# Patient Record
Sex: Male | Born: 1960 | Race: White | Hispanic: No | Marital: Married | Smoking: Former smoker
Health system: Southern US, Community
[De-identification: ages and names within clinical notes are randomized; demographics above are authoritative.]

## PROBLEM LIST (undated history)

## (undated) DIAGNOSIS — M549 Dorsalgia, unspecified: Secondary | ICD-10-CM

## (undated) DIAGNOSIS — R002 Palpitations: Secondary | ICD-10-CM

## (undated) DIAGNOSIS — I10 Essential (primary) hypertension: Secondary | ICD-10-CM

## (undated) DIAGNOSIS — I480 Paroxysmal atrial fibrillation: Secondary | ICD-10-CM

## (undated) DIAGNOSIS — E785 Hyperlipidemia, unspecified: Secondary | ICD-10-CM

## (undated) DIAGNOSIS — G8929 Other chronic pain: Secondary | ICD-10-CM

## (undated) HISTORY — DX: Paroxysmal atrial fibrillation: I48.0

## (undated) HISTORY — DX: Essential (primary) hypertension: I10

## (undated) HISTORY — DX: Other chronic pain: G89.29

## (undated) HISTORY — DX: Hyperlipidemia, unspecified: E78.5

## (undated) HISTORY — DX: Palpitations: R00.2

## (undated) HISTORY — DX: Dorsalgia, unspecified: M54.9

---

## 2002-04-17 ENCOUNTER — Emergency Department (HOSPITAL_COMMUNITY): Admission: EM | Admit: 2002-04-17 | Discharge: 2002-04-17 | Payer: Self-pay | Admitting: Emergency Medicine

## 2002-04-17 ENCOUNTER — Encounter: Payer: Self-pay | Admitting: Emergency Medicine

## 2003-10-13 ENCOUNTER — Ambulatory Visit (HOSPITAL_COMMUNITY): Admission: RE | Admit: 2003-10-13 | Discharge: 2003-10-13 | Payer: Self-pay | Admitting: Gastroenterology

## 2003-10-13 ENCOUNTER — Encounter (INDEPENDENT_AMBULATORY_CARE_PROVIDER_SITE_OTHER): Payer: Self-pay | Admitting: *Deleted

## 2004-01-13 ENCOUNTER — Encounter: Admission: RE | Admit: 2004-01-13 | Discharge: 2004-01-13 | Payer: Self-pay | Admitting: Gastroenterology

## 2007-05-30 ENCOUNTER — Inpatient Hospital Stay (HOSPITAL_COMMUNITY): Admission: EM | Admit: 2007-05-30 | Discharge: 2007-05-31 | Payer: Self-pay | Admitting: Emergency Medicine

## 2007-05-30 ENCOUNTER — Ambulatory Visit: Payer: Self-pay | Admitting: Cardiology

## 2007-05-30 ENCOUNTER — Encounter (INDEPENDENT_AMBULATORY_CARE_PROVIDER_SITE_OTHER): Payer: Self-pay | Admitting: *Deleted

## 2007-05-30 HISTORY — PX: TRANSTHORACIC ECHOCARDIOGRAM: SHX275

## 2008-05-15 ENCOUNTER — Encounter: Admission: RE | Admit: 2008-05-15 | Discharge: 2008-05-15 | Payer: Self-pay | Admitting: Family Medicine

## 2008-12-12 ENCOUNTER — Encounter: Admission: RE | Admit: 2008-12-12 | Discharge: 2008-12-12 | Payer: Self-pay | Admitting: Neurosurgery

## 2008-12-24 HISTORY — PX: CARDIOVASCULAR STRESS TEST: SHX262

## 2009-12-21 ENCOUNTER — Ambulatory Visit: Payer: Self-pay | Admitting: Cardiology

## 2010-05-25 ENCOUNTER — Other Ambulatory Visit: Payer: Self-pay | Admitting: *Deleted

## 2010-05-25 DIAGNOSIS — I480 Paroxysmal atrial fibrillation: Secondary | ICD-10-CM

## 2010-05-25 MED ORDER — DILTIAZEM HCL ER COATED BEADS 240 MG PO CP24: 240.0000 mg | ORAL_CAPSULE | Freq: Every day | ORAL | Status: DC

## 2010-05-25 NOTE — Telephone Encounter (Signed)
Pt called, c/o a constant headache after switching to Diltiazem 300 mg daily. No a.fib episodes recently and BP ok.  Norma Fredrickson NP notified and instructed RN to have pt go back to Diltiazem 240 mg daily and continue to monitor BP.  Patient notified and verbalized to RN understanding of instructions.

## 2010-07-20 NOTE — H&P (Signed)
[name], [name]                ACCOUNT NO.:  [id number]   MEDICAL RECORD NO.:  [id number]          PATIENT TYPE:  EMS   LOCATION:  ED                           FACILITY:  [facility]   PHYSICIAN:  Beckey Rutter, MD  DATE OF BIRTH:  September 09, 1960   DATE OF ADMISSION:  05/30/2007  DATE OF DISCHARGE:                              HISTORY & PHYSICAL   PRIMARY CARE PHYSICIAN:  Production assistant, radio.   CHIEF COMPLAINT:  Chest pain.   HISTORY OF PRESENT ILLNESS:  This is a 50 year old pleasant Caucasian  male with past medical history significant for hypertension and history  of ulcerative colitis, who came in today to the emergency department  because of chest pain that started yesterday.  The pain is intermittent  in nature, associated with palpitation.  The pain is dull, anterior left  chest area, with no radiation.  The pain has an on-and-off pattern and  each lasting for several minutes.  Again, no radiation and no  significant association with other symptoms like sweating.  The patient  noticed heart racing and sweating after midnight, and that is when his  wife noticed that his heart was beating very fast and irregular as well,  so the patient presented himself to the emergency department.   REVIEW OF SYSTEMS:  The patient is a former smoker.  No history of  rheumatic heart disease or valvular disease.  No history of any heart  condition previously.  No significant excessive alcohol or cocaine.  No  history of DVT or premature coronary artery disease.  No recent travel.   PAST MEDICAL HISTORY:  1. Hypertension.  2. Hyperlipidemia.  3. Tobacco abuse.  4. Ulcerative colitis.   MEDICATION ALLERGY:  Not known to have medication allergy.   MEDICATIONS:  1. Altace 5 mg daily.  2. Vitamin E 3200 international units daily.  3. Asacol three tablets twice a day.   FAMILY HISTORY:  Significant for hypertension.   SOCIAL HISTORY:  The patient is a current smoker.  No drug abuse.   No  ethanol abuse.  He is an occasional drinker.  Lives with his wife.   REVIEW OF SYSTEMS:  As mentioned above.   EXAM:  Temperature 97.9, blood pressure 112/76, pulse 74, respiratory  rate is 14.  Head atraumatic, normocephalic.  Eyes:  PERRL.  Mouth moist.  No ulcer.  NECK:  Supple, no JVD.  PRECORDIUM:  First and second heart sounds audible, regular to  auscultation.  LUNGS:  Bilateral fair air entry.  ABDOMEN:  Soft, nontender.  Bowel sounds present.  EXTREMITIES: No lower extremity edema.   LABS AND X-RAY:  EKG showing heart rate of 108 with atrial fibrillation,  normal axis, normal QRS complexes.  Note, the patient had sinus rhythm  at rate of 71 on the monitor when the patient was examined now.  Sodium  137, potassium 4.0, chloride 107, carbon dioxide 24, glucose 112, BUN  15, creatinine 0.77, calcium 9.1.  Myoglobin is 26.3, CK-MB less than  1.3, troponin less than 0.05.   ASSESSMENT AND PLAN:  This is a 50 year old  with hyperlipidemia, who  presented today with atrial fibrillation with rapid ventricular response  that responded to Cardizem for rate control.  Now the patient on  auscultation and on monitor seems to be back to sinus rhythm.  The  patient also was complaining of chest pain, that he probably needs to be  ruled out for acute coronary syndrome.   PLAN:  1. The patient will be admitted to a telemetry floor for further      assessment and management.  2. The patient will be ruled out with serial cardiac enzymes and      serial EKGs.  3. We will obtain 2-D echo and will obtain an EKG stat to document      what appeared to be sinus rhythm now.  4. We will start the patient on aspirin and Lovenox.  5. For ulcerative colitis we will continue the Asacol.  6. For her hypertension we will switch the patient from Altace to a      calcium channel blocker for rate control.  7. GI prophylaxis:  We will start the patient on Protonix.      Beckey Rutter, MD   Electronically Signed     EME/MEDQ  D:  05/30/2007  T:  05/30/2007  Job:  [id number]

## 2010-07-20 NOTE — Op Note (Signed)
[name], DEVONSHIRE                ACCOUNT NO.:  [id number]   MEDICAL RECORD NO.:  [id number]          PATIENT TYPE:  INP   LOCATION:  1445                         FACILITY:  [facility]   PHYSICIAN:  Colleen Can. Deborah Chalk, M.D.DATE OF BIRTH:  Sep 26, 1960   DATE OF PROCEDURE:  05/31/2007  DATE OF DISCHARGE:                               OPERATIVE REPORT   Thank you very much for asking me to see Mr. Habenicht.   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  He has some vague anterior chest discomfort.  There was some  lightheadedness 6 months ago when he would get in and out of his car,  but he really did not have any lightheadedness with the symptoms.  He  has noted about an 8 month duration of feeling like he will have  intermittent palpitations and generalized nervousness that is somewhat  internal, but will be short-lived.  There was some vague anterior chest  discomfort over the day prior to admission.  It has not been exertional  and generally he has a very active lifestyle.   He has history of alcohol use of about 3 beers 3 times a week.  He only  drinks 2 cups of coffee in the morning.  He does smoke a pack of  cigarettes and has done for probably 30-40 years.   PAST MEDICAL HISTORY:  Includes hypertension for the last 3-4 years,  treated with Altace.  He has had some cholesterol issues, of which are  not clearly defined.  He has had low HDL cholesterol.  He has had a  history of ulcerative colitis, treated with Asacol 3 times a day, and  hydrocortisone suppositories, directed by Dr. Georgiana Shore.   SOCIAL HISTORY:  He is married.  He runs a golf course.  Smokes, as  noted, and has 3 beers a day.   FAMILY HISTORY:  Positive for hypertension.  There is no coronary  disease.   REVIEW OF SYSTEMS:  Negative otherwise,  discussed in detail.   PHYSICAL EXAMINATION:  On exam his blood pressure is 115/70.  Currently  it is 70.  HEENT:  Negative.  NECK:  Supple.  LUNGS:  Clear.  HEART:  Shows regular rate and rhythm.  EXTREMITIES:  Without edema.   White count is 4,900.  Hematocrit is 44.  Platelets are 202,000.  Chemistries are all negative.  Troponins and cardiac enzymes are  negative.  BNP is less than 30.  TSH is 3.556.  Drug abuse screen is  negative.  Homocystine level is 8.2.   IMPRESSION:  1. Paroxysmal atrial fibrillation.  2. Cigarette abuse.  3. Hypercholesterolemia.  4. Hypertension.   PLAN:  Will check a 2D echocardiogram.  Will allow him to go home today.  Will discharge on Cardizem CD 120 mg a day and discontinue Altace and  aspirin 325 mg per day.  I will plan for stress Cardiolite study and I  will arrange.  Colleen Can. Deborah Chalk, M.D.  Electronically Signed     SNT/MEDQ  D:  05/31/2007  T:  05/31/2007  Job:  [id number]

## 2010-07-20 NOTE — Discharge Summary (Signed)
[name], [name]                ACCOUNT NO.:  [id number]   MEDICAL RECORD NO.:  [id number]          PATIENT TYPE:  INP   LOCATION:  1445                         FACILITY:  [facility]   PHYSICIAN:  Beckey Rutter, MD  DATE OF BIRTH:  06-Jul-1960   DATE OF ADMISSION:  05/30/2007  DATE OF DISCHARGE:                               DISCHARGE SUMMARY   PRIMARY CARE PHYSICIAN:  [facility].   CHIEF COMPLAINT:  On presentation, chest pain and palpitation.   HOSPITAL COURSE:  1. Atrial fibrillation.  The patient found to have atrial fibrillation      on admission that was converted to sinus after Cardizem injection      was given to control the rate.  The patient remained sinus since      yesterday on telemetry.  The patient was ruled out during the      hospital course for acute coronary syndromes by serial cardiac      enzymes and EKGs.   The patient was seen in consultation by Dr. Roger Shelter who was  recommending the stress Cardiolite as an outpatient.  The patient was  given a prescription for Cardizem CD 120 and he was encouraged to  continue on daily aspirin.  The patient does not require anticoagulation  as of now, i.e., chronic Coumadin use.  The patient is stable for  discharge today.  He is aware and agreeable to the discharge plan.   DISCHARGE MEDICATIONS:  1. Aspirin 325 mg daily.  2. Cardizem CD 120 mg daily.  3. Asacol 3 tablets twice a day.  4. Vitamin E 3200 units daily.   DISCHARGE DIAGNOSES:  1. Atrial fibrillation, lone atrial fibrillation.  The patient on      Cardizem p.o. currently.  2. Ulcerative colitis.   LAB TEST:  2-D echo done on May 30, 2007.  The summary was reading  overall left ventricular systolic function was normal, left ventricular  ejection fraction was estimated to be 55%.  There is no diagnostic  evidence of left ventricular regional wall motion abnormalities.  Left  ventricular wall thickness was mildly increased.   Features were  consistent with pseudo-normal left ventricular filling pattern, with  concomitant abnormal relaxation and increased filling pressure.   There was trivial tricuspid valvular regurgitation.   DISCHARGE/PLAN:  The patient was discharged to follow up with Dr.  Roger Shelter as discussed with him.  The patient is agreeable and  aware of the discharge plan.      Beckey Rutter, MD  Electronically Signed     EME/MEDQ  D:  05/31/2007  T:  05/31/2007  Job:  [id number]   cc:   [facility]  Fax: [phone]   Yetta Glassman. Loreta Ave, PA   [employer]. Deborah Chalk, M.D.  Fax: [phone]

## 2010-07-23 NOTE — Op Note (Signed)
NAME:  JAMESMICHAEL, SHADD                          ACCOUNT NO.:  [id number]   MEDICAL RECORD NO.:  [id number]                   PATIENT TYPE:  AMB   LOCATION:  ENDO                                 FACILITY:  [facility]   PHYSICIAN:  Anselmo Rod, M.D.               DATE OF BIRTH:  24-Jul-1960   DATE OF PROCEDURE:  10/13/2003  DATE OF DISCHARGE:                                 OPERATIVE REPORT   PROCEDURE PERFORMED:  Colonoscopy with biopsies.   ENDOSCOPIST:  Charna Elizabeth, M.D.   INSTRUMENT USED:  Olympus video colonoscope.   INDICATIONS FOR PROCEDURE:  The patient is a 50 year old white male with a  history of change in bowel habits and rectal bleeding for the last three  weeks.  Rule out colonic polyps, masses, etc.  The patient has a family  history of colon cancer in a paternal grandparent, as well.   PREPROCEDURE PREPARATION:  Informed consent was procured from the patient.  The patient was fasted for eight hours prior to the procedure and prepped  with a bottle of magnesium citrate and a gallon of GoLYTELY the night prior  to the procedure.   PREPROCEDURE PHYSICAL:  The patient had stable vital signs.  Neck supple.  Chest clear to auscultation.  S1 and S2 regular.  Abdomen soft with normal  bowel sounds.   DESCRIPTION OF PROCEDURE:  The patient was placed in left lateral decubitus  position and sedated with 75 mg of Demerol and 8 mg of Versed in slow  incremental doses.  Once the patient was adequately sedated and maintained  on low flow oxygen and continuous cardiac monitoring, the Olympus video  colonoscope was advanced from the rectum to the cecum.  There was some  residual stool in the colon and multiple washes were done.  Proctitis was  noted from 0 to 5 cm.  The terminal ileum appeared healthy and without  lesion.  Two small polyps were biopsied from the rectosigmoid colon.  These  were very small in size. The appendicular orifice and ileocecal valve were  clearly visualized  and photographed.  The terminal ileum appeared normal.   IMPRESSION:  1. Inflammatory changes noted in the rectum from 0 to 5 cm.  Question     ulcerative proctitis.  2. Two small polyps biopsied from rectosigmoid colon.  3. Normal-appearing transverse colon, right colon, cecum and terminal ileum.  4. Some residual stool in the colon, multiple washes done.   RECOMMENDATIONS:  1. Await pathology results.  Rule out proctitis.  2. Outpatient followup in the next two weeks for further recommendations.  3. Avoid all nonsteroidals for now.                                               Jyothi Nat  Loreta Ave, M.D.    JNM/MEDQ  D:  10/13/2003  T:  10/14/2003  Job:  [id number]   cc:   Marjory Lies, M.D.  [id number]  [place]  [place] [address]  Fax: [phone]

## 2010-09-27 ENCOUNTER — Encounter: Payer: Self-pay | Admitting: Nurse Practitioner

## 2010-09-28 ENCOUNTER — Encounter: Payer: Self-pay | Admitting: Nurse Practitioner

## 2010-09-28 ENCOUNTER — Ambulatory Visit (INDEPENDENT_AMBULATORY_CARE_PROVIDER_SITE_OTHER): Payer: BC Managed Care – PPO | Admitting: Nurse Practitioner

## 2010-09-28 VITALS — BP 134/90 | HR 78 | Ht 72.0 in | Wt 168.0 lb

## 2010-09-28 DIAGNOSIS — I480 Paroxysmal atrial fibrillation: Secondary | ICD-10-CM | POA: Insufficient documentation

## 2010-09-28 DIAGNOSIS — I10 Essential (primary) hypertension: Secondary | ICD-10-CM

## 2010-09-28 DIAGNOSIS — I4891 Unspecified atrial fibrillation: Secondary | ICD-10-CM

## 2010-09-28 MED ORDER — LISINOPRIL 5 MG PO TABS: 5.0000 mg | ORAL_TABLET | Freq: Every day | ORAL | Status: DC

## 2010-09-28 NOTE — Assessment & Plan Note (Signed)
Diastolic blood pressures are elevated at home. He has diabetes. We will start him on Lisinopril at 5 mg. First dose tonight at bedtime then one daily. I will see him back in one month. Will check BMET on return.  He is to continue to monitor at home. Patient is agreeable to this plan and will call if any problems develop in the interim.

## 2010-09-28 NOTE — Patient Instructions (Signed)
Stay on your current medicines We will add Lisinopril 5 mg daily to help with your blood pressure. I will see you in one month with blood work Call for any problems.

## 2010-09-28 NOTE — Progress Notes (Signed)
    [name] Date of Birth: November 27, 1960   History of Present Illness: [name] is seen today for a work in visit. He is seen for Dr. Shirlee Latch. He is a former patient of Dr. Ronnald Nian. He has history of PAF, HTN and glucose intolerance. He notes that his diastolic blood pressures are running in the high 90's. He feels a little jittery. He has lost weight just by cutting back on his carbs. Last A1C was 5.5. He sees Dr. Cyndia Bent for primary care. He has had no spells of arrhythmia in almost a year.   Current Outpatient Prescriptions on File Prior to Visit  Medication Sig Dispense Refill  . aspirin 81 MG tablet Take 81 mg by mouth daily.        . mesalamine (LIALDA) 1.2 G EC tablet Take 2,400 mg by mouth daily with breakfast.        . metFORMIN (GLUMETZA) 500 MG (MOD) 24 hr tablet Take 500 mg by mouth daily with breakfast.        . VIAGRA 100 MG tablet 100 mg as needed.       . vitamin E 1000 UNIT capsule Take 1,000 Units by mouth daily.       Marland Kitchen DISCONTD: diltiazem (CARTIA XT) 240 MG 24 hr capsule Take 1 capsule (240 mg total) by mouth daily.  30 capsule  6    No Known Allergies  Past Medical History  Diagnosis Date  . PAF (paroxysmal atrial fibrillation)   . Hypertension   . Chronic back pain   . Hyperlipidemia   . Ulcerative colitis   . Palpitation   . Diabetes mellitus     Past Surgical History  Procedure Date  . Transthoracic echocardiogram 05/30/2007     EF 55%  . Cardiovascular stress test 12/24/2008    EF 66% with no ischemia    History  Smoking status  . Former Smoker  . Quit date: 03/07/2006  Smokeless tobacco  . Never Used    History  Alcohol Use No    Family History  Problem Relation Age of Onset  . Arrhythmia Father   . Cardiomyopathy Father   . Hypertension Father     Review of Systems: The review of systems is as above.  All other systems were reviewed and are negative.  Physical Exam: BP 134/90  Pulse 78  Ht 6' (1.829 m)  Wt 168 lb (76.204 kg)   BMI 22.78 kg/m2 Patient is very pleasant and in no acute distress. Skin is warm and dry. He is suntanned. Color is normal.  HEENT is unremarkable. Normocephalic/atraumatic. PERRL. Sclera are nonicteric. Neck is supple. No masses. No JVD. Lungs are clear. Cardiac exam shows a regular rate and rhythm. Abdomen is soft. Extremities are without edema. Gait and ROM are intact. No gross neurologic deficits noted.  LABORATORY DATA:   Assessment / Plan:

## 2010-09-28 NOTE — Assessment & Plan Note (Signed)
No recurrence in almost one year.

## 2010-10-03 NOTE — Progress Notes (Signed)
Agree with note.  [name]  

## 2010-10-27 ENCOUNTER — Other Ambulatory Visit (INDEPENDENT_AMBULATORY_CARE_PROVIDER_SITE_OTHER): Payer: BC Managed Care – PPO | Admitting: *Deleted

## 2010-10-27 ENCOUNTER — Encounter: Payer: Self-pay | Admitting: Nurse Practitioner

## 2010-10-27 ENCOUNTER — Ambulatory Visit (INDEPENDENT_AMBULATORY_CARE_PROVIDER_SITE_OTHER): Payer: BC Managed Care – PPO | Admitting: Nurse Practitioner

## 2010-10-27 VITALS — BP 118/78 | HR 74 | Ht 72.0 in | Wt 167.0 lb

## 2010-10-27 DIAGNOSIS — R7989 Other specified abnormal findings of blood chemistry: Secondary | ICD-10-CM

## 2010-10-27 DIAGNOSIS — I10 Essential (primary) hypertension: Secondary | ICD-10-CM

## 2010-10-27 DIAGNOSIS — I480 Paroxysmal atrial fibrillation: Secondary | ICD-10-CM

## 2010-10-27 DIAGNOSIS — I4891 Unspecified atrial fibrillation: Secondary | ICD-10-CM

## 2010-10-27 LAB — BASIC METABOLIC PANEL
BUN: 15 mg/dL (ref 6–23)
CO2: 31 mEq/L (ref 19–32)
Calcium: 9.6 mg/dL (ref 8.4–10.5)
Chloride: 104 mEq/L (ref 96–112)
Creatinine, Ser: 1 mg/dL (ref 0.4–1.5)
GFR: 89.39 mL/min (ref >60.00)
Glucose, Bld: 125 mg/dL — ABNORMAL HIGH (ref 70–99)
Potassium: 4.9 mEq/L (ref 3.5–5.1)
Sodium: 140 mEq/L (ref 135–145)

## 2010-10-27 LAB — CBC WITH DIFFERENTIAL/PLATELET
Basophils Absolute: 0 10*3/uL (ref 0.0–0.1)
Basophils Relative: 0.7 % (ref 0.0–3.0)
Eosinophils Absolute: 0.1 10*3/uL (ref 0.0–0.7)
Eosinophils Relative: 3.6 % (ref 0.0–5.0)
HCT: 46.4 % (ref 39.0–52.0)
Hemoglobin: 15.8 g/dL (ref 13.0–17.0)
Lymphocytes Relative: 29.3 % (ref 12.0–46.0)
Lymphs Abs: 1 10*3/uL (ref 0.7–4.0)
MCHC: 34.1 g/dL (ref 30.0–36.0)
MCV: 89.6 fl (ref 78.0–100.0)
Monocytes Absolute: 0.4 10*3/uL (ref 0.1–1.0)
Monocytes Relative: 11.7 % (ref 3.0–12.0)
Neutro Abs: 1.9 10*3/uL (ref 1.4–7.7)
Neutrophils Relative %: 54.7 % (ref 43.0–77.0)
Platelets: 227 10*3/uL (ref 150.0–400.0)
RBC: 5.18 Mil/uL (ref 4.22–5.81)
RDW: 13.1 % (ref 11.5–14.6)
WBC: 3.4 10*3/uL — ABNORMAL LOW (ref 4.5–10.5)

## 2010-10-27 NOTE — Progress Notes (Signed)
    [name] Date of Birth: [birth year]   History of Present Illness: [name] is seen back today for a one month check. He is seen for Dr. Shirlee Latch. He is a former patient of Dr. Ronnald Nian. I placed him on low dose lisinopril last month for his blood pressure. He is doing great. He is tolerating his medicines. Blood pressure has come down nicely at home. No recurrent atrial fib. He feels good. He has had a recent abnormally low WBC count and would like to have repeated.   Current Outpatient Prescriptions on File Prior to Visit  Medication Sig Dispense Refill  . aspirin 81 MG tablet Take 81 mg by mouth daily.        Marland Kitchen diltiazem (CARDIZEM CD) 240 MG 24 hr capsule Take 300 mg by mouth daily.        Marland Kitchen lisinopril (PRINIVIL,ZESTRIL) 5 MG tablet Take 1 tablet (5 mg total) by mouth daily.  30 tablet  11  . mesalamine (LIALDA) 1.2 G EC tablet Take 2,400 mg by mouth daily with breakfast.        . VIAGRA 100 MG tablet 100 mg as needed.       . vitamin E 1000 UNIT capsule Take 1,000 Units by mouth daily.       . metFORMIN (GLUMETZA) 500 MG (MOD) 24 hr tablet Take 500 mg by mouth daily with breakfast. Ran out and has not been taking it      . DISCONTD: diltiazem (CARTIA XT) 240 MG 24 hr capsule Take 1 capsule (240 mg total) by mouth daily.  30 capsule  6    No Known Allergies  Past Medical History  Diagnosis Date  . PAF (paroxysmal atrial fibrillation)   . Hypertension   . Chronic back pain   . Hyperlipidemia   . Ulcerative colitis   . Palpitation   . Diabetes mellitus     Past Surgical History  Procedure Date  . Transthoracic echocardiogram 05/30/2007     EF 55%  . Cardiovascular stress test 12/24/2008    EF 66% with no ischemia    History  Smoking status  . Former Smoker  . Quit date: 03/07/2006  Smokeless tobacco  . Never Used    History  Alcohol Use No    Family History  Problem Relation Age of Onset  . Arrhythmia Father   . Cardiomyopathy Father   . Hypertension Father      Review of Systems: The review of systems is as above.  All other systems were reviewed and are negative.  Physical Exam: BP 118/78  Pulse 74  Ht 6' (1.829 m)  Wt 167 lb (75.751 kg)  BMI 22.65 kg/m2 Patient is very pleasant and in no acute distress. Skin is warm and dry. Color is normal.  HEENT is unremarkable. Normocephalic/atraumatic. PERRL. Sclera are nonicteric. Neck is supple. No masses. No JVD. Lungs are clear. Cardiac exam shows a regular rate and rhythm. Abdomen is soft. Extremities are without edema. Gait and ROM are intact. No gross neurologic deficits noted.   LABORATORY DATA: PENDING  Assessment / Plan:

## 2010-10-27 NOTE — Assessment & Plan Note (Signed)
Blood pressure looks better. He is tolerating his medicines. We will continue with his current medicines. He will continue to monitor. Will see him back in 6 months. Patient is agreeable to this plan and will call if any problems develop in the interim.

## 2010-10-27 NOTE — Patient Instructions (Signed)
Continue with your current medicines. Monitor your blood pressure at home.  Record your readings and bring to your next visit. Limit sodium intake. Call for any problems. We will see you in 6 months. You will see Dr. Marca Ancona

## 2010-10-27 NOTE — Assessment & Plan Note (Signed)
His rhythm has been good.

## 2010-10-29 ENCOUNTER — Telehealth: Payer: Self-pay | Admitting: *Deleted

## 2010-10-29 NOTE — Telephone Encounter (Signed)
Message copied by Lorayne Bender on Fri Oct 29, 2010  2:59 PM ------      Message from: Rosalio Macadamia      Created: Wed Oct 27, 2010  1:37 PM       Ok to report. Labs are satisfactory.

## 2010-10-29 NOTE — Telephone Encounter (Signed)
Lm w/lab results. Advised to follow up w/Dr. Cyndia Bent for low WBC. Will send copy to Dr. Cyndia Bent.

## 2010-11-01 NOTE — Progress Notes (Signed)
Agree with note.  [name]  

## 2010-11-29 LAB — CBC
HCT: 44.1
Hemoglobin: 15.2
MCHC: 34.5
MCV: 86.8
Platelets: 202
RBC: 5.08
RDW: 13.6
WBC: 4.9

## 2010-11-29 LAB — CARDIAC PANEL(CRET KIN+CKTOT+MB+TROPI)
CK, MB: 0.8
Relative Index: INVALID
Relative Index: INVALID
Total CK: 86
Troponin I: 0.02
Troponin I: 0.03

## 2010-11-29 LAB — RAPID URINE DRUG SCREEN, HOSP PERFORMED
Amphetamines: NOT DETECTED
Barbiturates: NOT DETECTED
Benzodiazepines: NOT DETECTED
Cocaine: NOT DETECTED
Opiates: NOT DETECTED
Tetrahydrocannabinol: NOT DETECTED

## 2010-11-29 LAB — HEMOGLOBIN A1C
Hgb A1c MFr Bld: 5.6
Mean Plasma Glucose: 122

## 2010-11-29 LAB — URINALYSIS, ROUTINE W REFLEX MICROSCOPIC
Bilirubin Urine: NEGATIVE
Glucose, UA: NEGATIVE
Hgb urine dipstick: NEGATIVE
Ketones, ur: NEGATIVE
Nitrite: NEGATIVE
Protein, ur: NEGATIVE
Specific Gravity, Urine: 1.016
Urobilinogen, UA: 0.2
pH: 5

## 2010-11-29 LAB — BASIC METABOLIC PANEL
BUN: 15
CO2: 24
Calcium: 9.1
Chloride: 107
Creatinine, Ser: 0.77
GFR calc Af Amer: >60
GFR calc non Af Amer: >60
Glucose, Bld: 112 — ABNORMAL HIGH
Potassium: 4
Sodium: 137

## 2010-11-29 LAB — CK TOTAL AND CKMB (NOT AT ARMC)
CK, MB: 1.1
Relative Index: 1
Total CK: 109

## 2010-11-29 LAB — DIFFERENTIAL
Basophils Absolute: 0
Basophils Relative: 1
Eosinophils Absolute: 0.2
Eosinophils Relative: 5
Lymphocytes Relative: 25
Lymphs Abs: 1.2
Monocytes Absolute: 0.5
Monocytes Relative: 9
Neutro Abs: 2.9
Neutrophils Relative %: 60

## 2010-11-29 LAB — POCT CARDIAC MARKERS
CKMB, poc: <1 — ABNORMAL LOW
Myoglobin, poc: 26.3
Operator id: 4001
Troponin i, poc: <0.05

## 2010-11-29 LAB — TROPONIN I: Troponin I: 0.02

## 2010-11-29 LAB — HOMOCYSTEINE: Homocysteine: 8.2

## 2010-11-29 LAB — B-NATRIURETIC PEPTIDE (CONVERTED LAB): Pro B Natriuretic peptide (BNP): <30

## 2010-11-29 LAB — LIPID PANEL
HDL: 31 — ABNORMAL LOW
Total CHOL/HDL Ratio: 4
Triglycerides: 109

## 2010-11-29 LAB — TSH: TSH: 0.556

## 2011-03-23 ENCOUNTER — Telehealth: Payer: Self-pay | Admitting: Cardiology

## 2011-03-23 NOTE — Telephone Encounter (Signed)
Reviewed case with Norma Fredrickson and advised pt to follow up with pcp.

## 2011-03-23 NOTE — Telephone Encounter (Signed)
New Msg: Pt calling c/o persistent headache for the past three weeks. Pt thinks headache may be related to pt BP. Pt has appt with Shirlee Latch 1/29 (former pt of Dr. Deborah Chalk). Pt would like to discuss with nurse possible options for relief until pt appt with Md. Please return pt call to discuss further.

## 2011-04-05 ENCOUNTER — Ambulatory Visit (INDEPENDENT_AMBULATORY_CARE_PROVIDER_SITE_OTHER): Payer: BC Managed Care – PPO | Admitting: Cardiology

## 2011-04-05 ENCOUNTER — Encounter: Payer: Self-pay | Admitting: Cardiology

## 2011-04-05 VITALS — BP 118/88 | HR 71 | Ht 73.0 in | Wt 176.0 lb

## 2011-04-05 DIAGNOSIS — I10 Essential (primary) hypertension: Secondary | ICD-10-CM

## 2011-04-05 DIAGNOSIS — I480 Paroxysmal atrial fibrillation: Secondary | ICD-10-CM

## 2011-04-05 DIAGNOSIS — I4891 Unspecified atrial fibrillation: Secondary | ICD-10-CM

## 2011-04-05 NOTE — Assessment & Plan Note (Signed)
Very rare tachypalpitation episodes.  He is quite symptomatic with these episodes, and I suspect they represent atrial fibrillation.   I will have him continue ASA and diltiazem.  CHADSVASC score is 1, therefore reasonable to leave off anticoagulation for now.  If he develops more frequent recurrences, he would be a good candidate for dronedarone.   

## 2011-04-05 NOTE — Patient Instructions (Signed)
Your physician wants you to follow-up in: 6 months with Dr McLean.(July 2013). You will receive a reminder letter in the mail two months in advance. If you don't receive a letter, please call our office to schedule the follow-up appointment.  

## 2011-04-05 NOTE — Progress Notes (Signed)
PCP: Dr. Cyndia Bent  51 yo with history paroxysmal atrial fibrillation presents for cardiolgy followup.  He was seen by Dr. Deborah Chalk in the past and sees me for the first time today.  He has noted episodes of atrial fibrillation since 2009.  Echo in 3/09 showed normal LV EF and normal RV.  Myoview in 10/10 was normal.   He knows when he is in atrial fibrillation: will get severe irregular tachypalpitations.  Episodes often awaken him from sleep and only last for 15-20 minutes.  He had an episode waking him from sleep about 3 weeks ago.  This was the first episode in > 1.5 years.  He has a CHADSVASC score of 1 and has not been on anticoagulation.  He avoids caffeine and no longer smokes.  BP is under control on current regimen.  He has good exercise tolerance in general: no exertional dyspnea or chest pain.  Walks on treadmill for 2-3 miles for exercise.    Labs (3/12): LDL 71, HDL 31 Labs (8/12): K 4.9, creatinine 1.0  ECG: NSR, normal  PMH: 1. Paroxysmal atrial fibrillation: Echo (3/09) with EF 55%, grade II diastolic dysfunction.  Stress myoview (10/10) with no ischemia, EF 66%.   2. HTN 3. Low back pain 4. Hyperlipidemia 5. Ulcerative colitis on mesalamine.   SH: Married with 3 children, lives in [place].  Quit smoking in 2008.  Used to work a tobacco farm, now Paramedic course.    FH: Father with CHF and atrial fibrillation in his 67s.  Uncle with MI at 65.   ROS: All systems reviewed and negative except as per HPI.   Current Outpatient Prescriptions  Medication Sig Dispense Refill  . aspirin 81 MG tablet Take 81 mg by mouth daily.        Marland Kitchen diltiazem (CARDIZEM CD) 240 MG 24 hr capsule Take 240 mg by mouth daily.       Marland Kitchen lisinopril (PRINIVIL,ZESTRIL) 5 MG tablet Take 1 tablet (5 mg total) by mouth daily.  30 tablet  11  . mesalamine (LIALDA) 1.2 G EC tablet Take 2,400 mg by mouth daily with breakfast.        . VIAGRA 100 MG tablet 100 mg as needed.       . vitamin E 1000 UNIT  capsule Take 1,000 Units by mouth daily.       Marland Kitchen DISCONTD: diltiazem (CARTIA XT) 240 MG 24 hr capsule Take 1 capsule (240 mg total) by mouth daily.  30 capsule  6    BP 118/88  Pulse 71  Ht 6\' 1"  (1.854 m)  Wt 79.833 kg (176 lb)  BMI 23.22 kg/m2 General: NAD Neck: No JVD, no thyromegaly or thyroid nodule.  Lungs: Clear to auscultation bilaterally with normal respiratory effort. CV: Nondisplaced PMI.  Heart regular S1/S2, no S3/S4, no murmur.  No peripheral edema.  No carotid bruit.  Normal pedal pulses.  Abdomen: Soft, nontender, no hepatosplenomegaly, no distention.   Neurologic: Alert and oriented x 3.  Psych: Normal affect. Extremities: No clubbing or cyanosis.

## 2011-04-05 NOTE — Assessment & Plan Note (Signed)
BP under good control on current regimen.  Continue current medication regimen.

## 2011-05-06 ENCOUNTER — Telehealth: Payer: Self-pay | Admitting: Cardiology

## 2011-05-06 NOTE — Telephone Encounter (Signed)
Pt Signed ROI, Labs,LOV mailed to Pt Home address 05/06/11/km

## 2011-06-21 ENCOUNTER — Other Ambulatory Visit: Payer: Self-pay

## 2011-06-21 ENCOUNTER — Other Ambulatory Visit: Payer: Self-pay | Admitting: Cardiology

## 2011-06-21 MED ORDER — DILTIAZEM HCL ER COATED BEADS 240 MG PO CP24: 240.0000 mg | ORAL_CAPSULE | Freq: Every day | ORAL | Status: DC

## 2011-06-21 NOTE — Telephone Encounter (Signed)
..   Requested Prescriptions   Pending Prescriptions Disp Refills  . CARTIA XT 240 MG 24 hr capsule [Pharmacy Med Name: CARTIA XT 240 MG CAPSULE] 30 each 6    Sig: TAKE 1 CAPSULE EVERY DAY.

## 2011-09-26 ENCOUNTER — Other Ambulatory Visit: Payer: Self-pay | Admitting: Cardiology

## 2011-09-26 MED ORDER — LISINOPRIL 5 MG PO TABS: 5.0000 mg | ORAL_TABLET | Freq: Every day | ORAL | Status: DC

## 2011-10-04 ENCOUNTER — Encounter: Payer: Self-pay | Admitting: Cardiology

## 2011-10-04 ENCOUNTER — Ambulatory Visit (INDEPENDENT_AMBULATORY_CARE_PROVIDER_SITE_OTHER): Payer: BC Managed Care – PPO | Admitting: Cardiology

## 2011-10-04 VITALS — BP 120/82 | HR 66 | Ht 73.0 in | Wt 176.0 lb

## 2011-10-04 DIAGNOSIS — I4891 Unspecified atrial fibrillation: Secondary | ICD-10-CM

## 2011-10-04 DIAGNOSIS — I480 Paroxysmal atrial fibrillation: Secondary | ICD-10-CM

## 2011-10-04 DIAGNOSIS — I10 Essential (primary) hypertension: Secondary | ICD-10-CM

## 2011-10-04 LAB — BASIC METABOLIC PANEL
BUN: 14 mg/dL (ref 6–23)
Chloride: 103 mEq/L (ref 96–112)
Creatinine, Ser: 0.9 mg/dL (ref 0.4–1.5)
GFR: 91.26 mL/min (ref >60.00)
Potassium: 4.8 mEq/L (ref 3.5–5.1)

## 2011-10-04 LAB — LIPID PANEL
LDL Cholesterol: 68 mg/dL (ref 0–99)
Total CHOL/HDL Ratio: 2
VLDL: 11.6 mg/dL (ref 0.0–40.0)

## 2011-10-04 MED ORDER — LISINOPRIL 5 MG PO TABS: 5.0000 mg | ORAL_TABLET | Freq: Every day | ORAL | Status: DC

## 2011-10-04 MED ORDER — DILTIAZEM HCL ER COATED BEADS 240 MG PO CP24: 240.0000 mg | ORAL_CAPSULE | Freq: Every day | ORAL | Status: DC

## 2011-10-04 NOTE — Progress Notes (Signed)
Patient ID: [name], male   DOB: Sep 11, 1960, 51 y.o.   MRN: [id number] PCP: Dr. Cyndia Bent  51 yo with history paroxysmal atrial fibrillation presents for cardiology followup.  He has noted episodes of atrial fibrillation since 2009.  Echo in 3/09 showed normal LV EF and normal RV.  Myoview in 10/10 was normal.   He knows when he is in atrial fibrillation: will get severe irregular tachypalpitations.  Episodes often awaken him from sleep and only last for 15-20 minutes.  Last episode was in 1/13.  He has a CHADSVASC score of 1 and has not been on anticoagulation.  He avoids caffeine and no longer smokes.  BP is under control on current regimen.  He has good exercise tolerance in general: no exertional dyspnea or chest pain.  Walks on treadmill for 2-3 miles for exercise.  He plays golf.  Mild occasional orthostatic-type symptoms.   Labs (3/12): LDL 71, HDL 31 Labs (8/12): K 4.9, creatinine 1.0  ECG: NSR, normal  PMH: 1. Paroxysmal atrial fibrillation: Echo (3/09) with EF 55%, grade II diastolic dysfunction.  Stress myoview (10/10) with no ischemia, EF 66%.   2. HTN 3. Low back pain 4. Hyperlipidemia 5. Ulcerative colitis on mesalamine.   SH: Married with 3 children, lives in [place].  Quit smoking in 2008.  Used to work a tobacco farm, now Paramedic course.    FH: Father with CHF and atrial fibrillation in his 62s.  Uncle with MI at 37.    Current Outpatient Prescriptions  Medication Sig Dispense Refill  . aspirin 81 MG tablet Take 81 mg by mouth daily.        Marland Kitchen diltiazem (CARTIA XT) 240 MG 24 hr capsule Take 1 capsule (240 mg total) by mouth daily.  90 capsule  3  . lisinopril (PRINIVIL,ZESTRIL) 5 MG tablet Take 1 tablet (5 mg total) by mouth daily.  90 tablet  3  . mesalamine (LIALDA) 1.2 G EC tablet Take 2,400 mg by mouth daily with breakfast.        . VIAGRA 100 MG tablet 100 mg as needed.       . vitamin E 1000 UNIT capsule Take 1,000 Units by mouth daily.       Marland Kitchen  DISCONTD: diltiazem (CARTIA XT) 240 MG 24 hr capsule Take 1 capsule (240 mg total) by mouth daily.  90 capsule  4  . DISCONTD: lisinopril (PRINIVIL,ZESTRIL) 5 MG tablet Take 1 tablet (5 mg total) by mouth daily.  30 tablet  5    BP 120/82  Pulse 66  Ht 6\' 1"  (1.854 m)  Wt 176 lb (79.833 kg)  BMI 23.22 kg/m2 General: NAD Neck: No JVD, no thyromegaly or thyroid nodule.  Lungs: Clear to auscultation bilaterally with normal respiratory effort. CV: Nondisplaced PMI.  Heart regular S1/S2, no S3/S4, no murmur.  No peripheral edema.  No carotid bruit.  Normal pedal pulses.  Abdomen: Soft, nontender, no hepatosplenomegaly, no distention.   Neurologic: Alert and oriented x 3.  Psych: Normal affect. Extremities: No clubbing or cyanosis.

## 2011-10-04 NOTE — Patient Instructions (Addendum)
Your physician recommends that you return for a FASTING lipid profile /BMET today.  Your physician wants you to follow-up in: 1 year with Dr Shirlee Latch. (July 2014). You will receive a reminder letter in the mail two months in advance. If you don't receive a letter, please call our office to schedule the follow-up appointment.

## 2011-10-04 NOTE — Assessment & Plan Note (Addendum)
BP controlled on current regimen.   I will check fasting lipids today.

## 2011-10-04 NOTE — Assessment & Plan Note (Signed)
Very rare tachypalpitation episodes.  He is quite symptomatic with these episodes, and I suspect they represent atrial fibrillation.   I will have him continue ASA and diltiazem.  CHADSVASC score is 1, therefore reasonable to leave off anticoagulation for now.  If he develops more frequent recurrences, he would be a good candidate for dronedarone.

## 2012-07-31 ENCOUNTER — Encounter: Payer: Self-pay | Admitting: Cardiology

## 2012-10-23 ENCOUNTER — Other Ambulatory Visit: Payer: Self-pay | Admitting: Cardiology

## 2012-11-01 ENCOUNTER — Ambulatory Visit: Payer: BC Managed Care – PPO | Admitting: Cardiology

## 2012-11-27 ENCOUNTER — Ambulatory Visit: Payer: BC Managed Care – PPO | Admitting: Cardiology

## 2012-12-18 ENCOUNTER — Ambulatory Visit (INDEPENDENT_AMBULATORY_CARE_PROVIDER_SITE_OTHER): Payer: BC Managed Care – PPO | Admitting: Cardiology

## 2012-12-18 ENCOUNTER — Encounter: Payer: Self-pay | Admitting: Cardiology

## 2012-12-18 VITALS — BP 120/75 | HR 69 | Wt 180.0 lb

## 2012-12-18 DIAGNOSIS — I4891 Unspecified atrial fibrillation: Secondary | ICD-10-CM

## 2012-12-18 DIAGNOSIS — I480 Paroxysmal atrial fibrillation: Secondary | ICD-10-CM

## 2012-12-18 DIAGNOSIS — I10 Essential (primary) hypertension: Secondary | ICD-10-CM

## 2012-12-18 MED ORDER — DILTIAZEM HCL ER COATED BEADS 240 MG PO CP24: ORAL_CAPSULE | ORAL | Status: DC

## 2012-12-18 MED ORDER — LISINOPRIL 5 MG PO TABS: 5.0000 mg | ORAL_TABLET | Freq: Every day | ORAL | Status: DC

## 2012-12-18 NOTE — Progress Notes (Signed)
Patient ID: [name], male   DOB: 07-21-1960, 52 y.o.   MRN: [id number] PCP: Dr. Cyndia Bent  52 yo with history paroxysmal atrial fibrillation presents for cardiology followup.  He has noted episodes of atrial fibrillation since 2009.  Echo in 3/09 showed normal LV EF and normal RV.  Myoview in 10/10 was normal.   He knows when he is in atrial fibrillation: will get severe irregular tachypalpitations.  Episodes often awaken him from sleep and only last for 15-20 minutes.  He notes episodes typically after he has drunk a couple of beers.  He has had 2-3 episodes in the last year. He has a CHADSVASC score of 1 and has not been on anticoagulation.  He avoids caffeine and no longer smokes.  BP is under control on current regimen.  He has good exercise tolerance in general: no exertional dyspnea or chest pain.  Walks on treadmill for 2-3 miles for exercise.  He plays golf.  Mild occasional orthostatic-type symptoms.   Labs (3/12): LDL 71, HDL 31 Labs (8/12): K 4.9, creatinine 1.0 Labs (7/13): K 4.8, creatinine 0.9, LDL 68, HDL 61  ECG: NSR, normal  PMH: 1. Paroxysmal atrial fibrillation: Echo (3/09) with EF 55%, grade II diastolic dysfunction.  Stress myoview (10/10) with no ischemia, EF 66%.   2. HTN 3. Low back pain 4. Hyperlipidemia 5. Ulcerative colitis on mesalamine.   SH: Married with 3 children, lives in [place].  Quit smoking in 2008.  Used to work a tobacco farm, now Paramedic course.  Occasional ETOH, not heavy.   FH: Father with CHF and atrial fibrillation in his 91s.  Uncle with MI at 65.    Current Outpatient Prescriptions  Medication Sig Dispense Refill  . aspirin 81 MG tablet Take 81 mg by mouth daily.        Marland Kitchen diltiazem (CARDIZEM CD) 240 MG 24 hr capsule TAKE 1 CAPSULE EVERY DAY.  90 capsule  3  . lisinopril (PRINIVIL,ZESTRIL) 5 MG tablet Take 1 tablet (5 mg total) by mouth daily.  90 tablet  3  . mesalamine (LIALDA) 1.2 G EC tablet Take 2,400 mg by mouth daily with  breakfast.        . VIAGRA 100 MG tablet 100 mg as needed.       . vitamin E 1000 UNIT capsule Take 1,000 Units by mouth daily.        No current facility-administered medications for this visit.    BP 120/75  Pulse 69  Wt 81.647 kg (180 lb)  BMI 23.75 kg/m2 General: NAD Neck: No JVD, no thyromegaly or thyroid nodule.  Lungs: Clear to auscultation bilaterally with normal respiratory effort. CV: Nondisplaced PMI.  Heart regular S1/S2, no S3/S4, no murmur.  No peripheral edema.  No carotid bruit.  Normal pedal pulses.  Abdomen: Soft, nontender, no hepatosplenomegaly, no distention.   Neurologic: Alert and oriented x 3.  Psych: Normal affect. Extremities: No clubbing or cyanosis.   Assessment/Plan: 1. Atrial fibrillation: Paroxysmal.  2-3 brief symptomatic episodes in the last year, notes them after drinking beer.  Continue ASA 81 and diltiazem.  CHADSVASC = 1, not anticoagulated.  2. HTN: BP is under good control.  3. Will check lipids.   Marca Ancona 12/18/2012

## 2012-12-18 NOTE — Patient Instructions (Addendum)
Your physician recommends that you return for a FASTING lipid profile /BMET/CBCd  Your physician wants you to follow-up in: 1 year with Dr Shirlee Latch.(October 2015) You will receive a reminder letter in the mail two months in advance. If you don't receive a letter, please call our office to schedule the follow-up appointment.

## 2012-12-19 ENCOUNTER — Other Ambulatory Visit (INDEPENDENT_AMBULATORY_CARE_PROVIDER_SITE_OTHER): Payer: BC Managed Care – PPO

## 2012-12-19 DIAGNOSIS — I4891 Unspecified atrial fibrillation: Secondary | ICD-10-CM

## 2012-12-19 DIAGNOSIS — I10 Essential (primary) hypertension: Secondary | ICD-10-CM

## 2012-12-19 LAB — BASIC METABOLIC PANEL
BUN: 18 mg/dL (ref 6–23)
Calcium: 9.1 mg/dL (ref 8.4–10.5)
Creatinine, Ser: 0.9 mg/dL (ref 0.4–1.5)
GFR: 91.96 mL/min (ref >60.00)

## 2012-12-19 LAB — CBC WITH DIFFERENTIAL/PLATELET
Basophils Absolute: 0 10*3/uL (ref 0.0–0.1)
Basophils Relative: 0.7 % (ref 0.0–3.0)
Eosinophils Absolute: 0.2 10*3/uL (ref 0.0–0.7)
Hemoglobin: 14 g/dL (ref 13.0–17.0)
Lymphocytes Relative: 20.2 % (ref 12.0–46.0)
Lymphs Abs: 0.7 10*3/uL (ref 0.7–4.0)
MCHC: 34.2 g/dL (ref 30.0–36.0)
MCV: 88.2 fl (ref 78.0–100.0)
Monocytes Absolute: 0.4 10*3/uL (ref 0.1–1.0)
Neutro Abs: 2 10*3/uL (ref 1.4–7.7)
RBC: 4.64 Mil/uL (ref 4.22–5.81)
RDW: 13.1 % (ref 11.5–14.6)

## 2012-12-19 LAB — LIPID PANEL
Cholesterol: 119 mg/dL (ref 0–200)
LDL Cholesterol: 59 mg/dL (ref 0–99)
Triglycerides: 84 mg/dL (ref 0.0–149.0)

## 2013-12-19 ENCOUNTER — Ambulatory Visit: Payer: BC Managed Care – PPO | Admitting: Cardiology

## 2013-12-25 ENCOUNTER — Other Ambulatory Visit: Payer: Self-pay | Admitting: Cardiology

## 2014-01-01 ENCOUNTER — Ambulatory Visit: Payer: BC Managed Care – PPO | Admitting: Cardiology

## 2014-01-02 ENCOUNTER — Other Ambulatory Visit: Payer: Self-pay

## 2014-01-02 DIAGNOSIS — I480 Paroxysmal atrial fibrillation: Secondary | ICD-10-CM

## 2014-01-02 DIAGNOSIS — I10 Essential (primary) hypertension: Secondary | ICD-10-CM

## 2014-01-02 MED ORDER — DILTIAZEM HCL ER COATED BEADS 240 MG PO CP24: ORAL_CAPSULE | ORAL | Status: DC

## 2014-01-02 MED ORDER — LISINOPRIL 5 MG PO TABS: ORAL_TABLET | ORAL | Status: DC

## 2014-03-14 ENCOUNTER — Encounter: Payer: Self-pay | Admitting: Cardiology

## 2014-03-14 ENCOUNTER — Ambulatory Visit (INDEPENDENT_AMBULATORY_CARE_PROVIDER_SITE_OTHER): Payer: BLUE CROSS/BLUE SHIELD | Admitting: Cardiology

## 2014-03-14 VITALS — BP 110/70 | HR 72 | Ht 73.0 in | Wt 183.0 lb

## 2014-03-14 DIAGNOSIS — I10 Essential (primary) hypertension: Secondary | ICD-10-CM

## 2014-03-14 DIAGNOSIS — I480 Paroxysmal atrial fibrillation: Secondary | ICD-10-CM

## 2014-03-14 MED ORDER — DILTIAZEM HCL ER COATED BEADS 240 MG PO CP24: ORAL_CAPSULE | ORAL | Status: DC

## 2014-03-14 MED ORDER — LISINOPRIL 5 MG PO TABS: ORAL_TABLET | ORAL | Status: DC

## 2014-03-14 NOTE — Patient Instructions (Signed)
Your physician wants you to follow-up in: 1 year with Dr McLean. (January 2017). You will receive a reminder letter in the mail two months in advance. If you don't receive a letter, please call our office to schedule the follow-up appointment.  

## 2014-03-16 NOTE — Progress Notes (Signed)
Patient ID: William MundaLuke S Novick, male   DOB: 05/07/1960, 54 y.o.   MRN: [id number] PCP: Dr. Cyndia BentBadger  54 yo with history paroxysmal atrial fibrillation presents for cardiology followup.  He has noted episodes of atrial fibrillation since 2009.  Echo in 3/09 showed normal LV EF and normal RV.  Myoview in 10/10 was normal.   He knows when he is in atrial fibrillation: will get severe irregular tachypalpitations.  Episodes often awaken him from sleep and only last for 15-20 minutes.  He notes episodes typically after he has drunk a couple of beers (but not with wine).  Last episode was about 6 months ago and was brief. He has a CHADSVASC score of 1 and has not been on anticoagulation.  He avoids caffeine and no longer smokes.  BP is under control on current regimen.  He has good exercise tolerance in general: no exertional dyspnea or chest pain.  Walks on treadmill for 2-3 miles for exercise.  He plays golf.    Labs (3/12): LDL 71, HDL 31 Labs (8/12): K 4.9, creatinine 1.0 Labs (7/13): K 4.8, creatinine 0.9, LDL 68, HDL 61 Labs (10/14): K 4.1, creatinine 0.9, LDL 59, HDL 43, HCT 40.9  ECG: NSR, iRBBB, inferior Qs doubt significant,  PMH: 1. Paroxysmal atrial fibrillation: Echo (3/09) with EF 55%, grade II diastolic dysfunction.  Stress myoview (10/10) with no ischemia, EF 66%.   2. HTN 3. Low back pain 4. Hyperlipidemia 5. Ulcerative colitis on mesalamine.   SH: Married [place] 3 children, [place].  Quit smoking in 2008.  Used to work a tobacco farm, now Paramedicmanages a golf course.  Occasional ETOH, not heavy.   FH: Father with CHF and atrial fibrillation in his 1680s.  Uncle with MI at 6963.    Current Outpatient Prescriptions  Medication Sig Dispense Refill  . aspirin 81 MG tablet Take 81 mg by mouth daily.      Marland Kitchen. diltiazem (CARDIZEM CD) 240 MG 24 hr capsule TAKE 1 CAPSULE EVERY DAY. 90 capsule 3  . lisinopril (PRINIVIL,ZESTRIL) 5 MG tablet TAKE 1 TABLET ONCE DAILY. 90 tablet 3  . mesalamine  (LIALDA) 1.2 G EC tablet Take 2,400 mg by mouth daily with breakfast.      . vitamin E 1000 UNIT capsule Take 1,000 Units by mouth daily.     Marland Kitchen. VIAGRA 100 MG tablet 100 mg as needed.      No current facility-administered medications for this visit.    BP 110/70 mmHg  Pulse 72  Ht 6\' 1"  (1.854 m)  Wt 183 lb (83.008 kg)  BMI 24.15 kg/m2 General: NAD Neck: No JVD, no thyromegaly or thyroid nodule.  Lungs: Clear to auscultation bilaterally with normal respiratory effort. CV: Nondisplaced PMI.  Heart regular S1/S2, no S3/S4, no murmur.  No peripheral edema.  No carotid bruit.  Normal pedal pulses.  Abdomen: Soft, nontender, no hepatosplenomegaly, no distention.   Neurologic: Alert and oriented x 3.  Psych: Normal affect. Extremities: No clubbing or cyanosis.   Assessment/Plan: 1. Atrial fibrillation: Paroxysmal.  Last episode was brief about 6 months ago, notes them after drinking beer.  Continue ASA 81 and diltiazem.  CHADSVASC = 1, not anticoagulated.  If he has a prolonged episode (more than a few hours) would go to the ER for flecainide administration.  If he were to tolerate this ok in the ER, I could give him a prescription for use as pill-in-pocket prn.  2. HTN: BP is under good control.  Marca Ancona 03/16/2014

## 2014-08-16 ENCOUNTER — Other Ambulatory Visit: Payer: Self-pay | Admitting: Cardiology

## 2015-05-09 ENCOUNTER — Other Ambulatory Visit: Payer: Self-pay | Admitting: Cardiology

## 2015-06-06 ENCOUNTER — Other Ambulatory Visit: Payer: Self-pay | Admitting: Cardiology

## 2015-07-05 ENCOUNTER — Other Ambulatory Visit: Payer: Self-pay | Admitting: Cardiology

## 2015-08-06 ENCOUNTER — Other Ambulatory Visit: Payer: Self-pay | Admitting: Cardiology

## 2015-08-13 ENCOUNTER — Encounter: Payer: Self-pay | Admitting: Cardiology

## 2015-09-01 ENCOUNTER — Ambulatory Visit (INDEPENDENT_AMBULATORY_CARE_PROVIDER_SITE_OTHER): Payer: BLUE CROSS/BLUE SHIELD | Admitting: Cardiology

## 2015-09-01 VITALS — BP 114/72 | HR 75 | Ht 72.0 in | Wt 181.8 lb

## 2015-09-01 DIAGNOSIS — I480 Paroxysmal atrial fibrillation: Secondary | ICD-10-CM

## 2015-09-01 DIAGNOSIS — I10 Essential (primary) hypertension: Secondary | ICD-10-CM

## 2015-09-01 LAB — T3, FREE: T3, Free: 3.1 pg/mL (ref 2.3–4.2)

## 2015-09-01 LAB — T4, FREE: FREE T4: 1.2 ng/dL (ref 0.8–1.8)

## 2015-09-01 LAB — TSH: TSH: 0.4 mIU/L (ref 0.40–4.50)

## 2015-09-01 MED ORDER — LISINOPRIL 5 MG PO TABS: 5.0000 mg | ORAL_TABLET | Freq: Every day | ORAL | Status: AC

## 2015-09-01 MED ORDER — DILTIAZEM HCL ER COATED BEADS 240 MG PO CP24: 240.0000 mg | ORAL_CAPSULE | Freq: Every day | ORAL | Status: AC

## 2015-09-01 NOTE — Patient Instructions (Signed)
Medication Instructions:  Your physician recommends that you continue on your current medications as directed. Please refer to the Current Medication list given to you today.   Labwork: Your physician recommends that you return for lab work today    Testing/Procedures: None ordered   Follow-Up: Your physician wants you to follow-up in: 12 months with Dr Earlean ShawlMcLean You will receive a reminder letter in the mail two months in advance. If you don't receive a letter, please call our office to schedule the follow-up appointment.   Any Other Special Instructions Will Be Listed Below (If Applicable).     If you need a refill on your cardiac medications before your next appointment, please call your pharmacy.

## 2015-09-03 ENCOUNTER — Encounter: Payer: Self-pay | Admitting: Cardiology

## 2015-09-03 NOTE — Progress Notes (Signed)
Patient ID: [name], male   DOB: 03/01/1961, 55 y.o.   MRN: [id number] PCP: Dr. Cyndia BentBadger  55 yo with history paroxysmal atrial fibrillation presents for cardiology followup.  He has noted episodes of atrial fibrillation since 2009.  Echo in 3/09 showed normal LV EF and normal RV.  Myoview in 10/10 was normal.   He knows when he is in atrial fibrillation: will get severe irregular tachypalpitations.  Episodes often awaken him from sleep and only last for 15-20 minutes.  He notes episodes typically after he has drunk a couple of beers (but not with wine).  He has a CHADSVASC score of 1 and has not been on anticoagulation.  He avoids caffeine and no longer smokes.  BP is under control on current regimen.  He has good exercise tolerance in general: no exertional dyspnea or chest pain.  Walks on treadmill for 2-3 miles for exercise.  He plays golf.    He has about 10 episodes of symptomatic atrial fibrillation/year.  The symptoms rare last over an hour.  No dyspnea, chest pain, lightheadedness when he feels atrial fibrillation (just feels "jittery").    Labs (3/12): LDL 71, HDL 31 Labs (8/12): K 4.9, creatinine 1.0 Labs (7/13): K 4.8, creatinine 0.9, LDL 68, HDL 61 Labs (10/14): K 4.1, creatinine 0.9, LDL 59, HDL 43, HCT 40.9 Labs (6/17): TSH low, LDL 67, HCT 43.6, K 4.6, creatinine 0.81, LFTs normal.   ECG: NSR, normal  PMH: 1. Paroxysmal atrial fibrillation: Echo (3/09) with EF 55%, grade II diastolic dysfunction.  Stress myoview (10/10) with no ischemia, EF 66%.   2. HTN 3. Low back pain 4. Hyperlipidemia 5. Ulcerative colitis on mesalamine.   SH: Married with 3 children, [place].  Quit smoking in 2008.  Used to work a tobacco farm, now Paramedicmanages a golf course.  Occasional ETOH, not heavy.   FH: Father with CHF and atrial fibrillation in his 6780s.  Uncle with MI at 4663.    Current Outpatient Prescriptions  Medication Sig Dispense Refill  . aspirin 81 MG tablet Take 81 mg by  mouth daily.      Marland Kitchen. diltiazem (CARDIZEM CD) 240 MG 24 hr capsule Take 1 capsule (240 mg total) by mouth daily. 90 capsule 3  . lisinopril (PRINIVIL,ZESTRIL) 5 MG tablet Take 1 tablet (5 mg total) by mouth daily. 90 tablet 3  . mesalamine (LIALDA) 1.2 G EC tablet Take 2,400 mg by mouth daily with breakfast.      . VIAGRA 100 MG tablet Take 100 mg by mouth as directed. As needed    . vitamin E 1000 UNIT capsule Take 1,000 Units by mouth daily.      No current facility-administered medications for this visit.    BP 114/72 mmHg  Pulse 75  Ht 6' (1.829 m)  Wt 181 lb 12.8 oz (82.464 kg)  BMI 24.65 kg/m2  SpO2 97% General: NAD Neck: No JVD, no thyromegaly or thyroid nodule.  Lungs: Clear to auscultation bilaterally with normal respiratory effort. CV: Nondisplaced PMI.  Heart regular S1/S2, no S3/S4, no murmur.  No peripheral edema.  No carotid bruit.  Normal pedal pulses.  Abdomen: Soft, nontender, no hepatosplenomegaly, no distention.   Neurologic: Alert and oriented x 3.  Psych: Normal affect. Extremities: No clubbing or cyanosis.   Assessment/Plan: 1. Atrial fibrillation: Paroxysmal.  Episodes are short and do not bother him a lot.  Continue ASA 81 and diltiazem.  CHADSVASC = 1, not anticoagulated.  If  he has a prolonged episode (more than a few hours) would go to the ER for flecainide administration.  If he were to tolerate this ok in the ER, I could give him a prescription for use as pill-in-pocket prn. We discussed starting daily flecainide, but he wants to try to avoid antiarrhythmic.  2. HTN: BP is under good control.  3. Low TSH: Concerning for hyperthyroidism, which may be making atrial fibrillation more frequent.  I will recheck TSH and send free T3 and free T4. Will refer to endocrinology if looks like hyperthyroidism.   Marca AnconaDalton Naleigha Raimondi 09/03/2015

## 2015-12-17 ENCOUNTER — Encounter (INDEPENDENT_AMBULATORY_CARE_PROVIDER_SITE_OTHER): Payer: BLUE CROSS/BLUE SHIELD | Admitting: Physical Medicine and Rehabilitation

## 2015-12-17 DIAGNOSIS — M47817 Spondylosis without myelopathy or radiculopathy, lumbosacral region: Secondary | ICD-10-CM

## 2016-08-23 ENCOUNTER — Ambulatory Visit (INDEPENDENT_AMBULATORY_CARE_PROVIDER_SITE_OTHER): Payer: BLUE CROSS/BLUE SHIELD | Admitting: Orthopaedic Surgery

## 2016-08-23 ENCOUNTER — Ambulatory Visit (INDEPENDENT_AMBULATORY_CARE_PROVIDER_SITE_OTHER): Payer: Self-pay

## 2016-08-23 ENCOUNTER — Encounter (INDEPENDENT_AMBULATORY_CARE_PROVIDER_SITE_OTHER): Payer: Self-pay | Admitting: Orthopaedic Surgery

## 2016-08-23 VITALS — BP 127/82 | HR 76 | Ht 72.0 in | Wt 180.0 lb

## 2016-08-23 DIAGNOSIS — M545 Low back pain: Secondary | ICD-10-CM

## 2016-08-23 DIAGNOSIS — G8929 Other chronic pain: Secondary | ICD-10-CM

## 2016-08-23 DIAGNOSIS — M25552 Pain in left hip: Secondary | ICD-10-CM | POA: Diagnosis not present

## 2016-08-23 DIAGNOSIS — M25551 Pain in right hip: Secondary | ICD-10-CM | POA: Diagnosis not present

## 2016-08-23 MED ORDER — PREDNISONE 10 MG (21) PO TBPK: ORAL_TABLET | ORAL | 0 refills | Status: DC

## 2016-08-23 NOTE — Progress Notes (Addendum)
Office Visit Note   Patient: [name]           Date of Birth: 03/08/1960           MRN: [id number] Visit Date: 08/23/2016              Requested by: William InchBadger, [name], MD [address] [place], [place] [id number] PCP: William InchBadger, [name], MD   Assessment & Plan: Visit Diagnoses:  1. Chronic bilateral low back pain, with sciatica presence unspecified   2. Bilateral hip pain     Plan: MRI scan lumbar spine with and without contrast with past history of disc herniation. We'll place him on a prednisone pack 10 mg taper. Office follow-up after MRI scan.  Follow-Up Instructions: Office follow-up after lumbar MRI with and without contrast.  Orders:  Orders Placed This Encounter  Procedures  . XR Lumbar Spine 2-3 Views  . XR Pelvis 1-2 Views   No orders of the defined types were placed in this encounter.     Procedures: No procedures performed   Clinical Data: No additional findings.   Subjective: Chief Complaint  Patient presents with  . 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 Once he is up and erect he does better he denies any associated bowel or bladder symptoms. Complex history of previous epidural injections and radiofrequency ablation for facet syndrome last procedure was done in the fall. Last MRI was done in 2010. Previous microdiscectomy by Dr. Wyline MoodBranch in 2010.  Review of Systems 14 point review of systems positive for facet arthropathy lumbar disc degeneration. Hypertension, paroxysmal atrial fibrillation. Otherwise negative as it pertains to his history of present illness   Objective: Vital Signs: BP 127/82   Pulse 76   Ht 6' (1.829 m)   Wt 180 lb (81.6 kg)   BMI 24.41 kg/m   Physical Exam  Constitutional: He is oriented to person, place, and time. He appears  well-developed and well-nourished.  HENT:  Head: Normocephalic and atraumatic.  Eyes: EOM are normal. Pupils are equal, round, and reactive to light.  Neck: No tracheal deviation present. No thyromegaly present.  Cardiovascular: Normal rate.   Pulmonary/Chest: Effort normal. He has no wheezes.  Abdominal: Soft. Bowel sounds are normal.  Musculoskeletal:  Normal hip range of motion he slipped getting percent standing once he is erect he can ambulate heel and toe walk. Anterior tib EHL gastrocsoleus is strong. He has some sciatic notch tenderness pain with the lumbar rotation pain with extension tenderness over the L4-5 L5-S1 facet region. Pearlean BrownieFaber test is negative no limitation of internal rotation of his hips. No knee effusion no venous stasis changes no rash or exposed skin.  Neurological: He is alert and oriented to person, place, and time.  Skin: Skin is warm and dry. Capillary refill takes less than 2 seconds.  Psychiatric: He has a normal mood and affect. His behavior is normal. Judgment and thought content normal.    Ortho Exam  Specialty Comments:  No specialty comments available.  Imaging: No results found.   PMFS History: Patient Active Problem List   Diagnosis Date Noted  . HTN (hypertension) 09/28/2010  . PAF (paroxysmal atrial fibrillation) (HCC) 09/28/2010   Past Medical History:  Diagnosis Date  . Chronic back pain   . Diabetes  mellitus   . Hyperlipidemia   . Hypertension   . PAF (paroxysmal atrial fibrillation) (HCC)   . Palpitation   . Ulcerative colitis     Family History  Problem Relation Age of Onset  . Arrhythmia Father   . Cardiomyopathy Father   . Hypertension Father     Past Surgical History:  Procedure Laterality Date  . CARDIOVASCULAR STRESS TEST  12/24/2008   EF 66% with no ischemia  . TRANSTHORACIC ECHOCARDIOGRAM  05/30/2007    EF 55%   Social History   Occupational History  . Not on file.   Social History Main Topics  . Smoking  status: Former Smoker    Quit date: 03/07/2006  . Smokeless tobacco: Never Used  . Alcohol use No  . Drug use: No  . Sexual activity: Yes

## 2016-08-23 NOTE — Addendum Note (Signed)
Addended by: Rogers SeedsYEATTS, Darianny Momon M on: 08/23/2016 03:17 PM   Modules accepted: Orders

## 2016-08-25 ENCOUNTER — Other Ambulatory Visit (INDEPENDENT_AMBULATORY_CARE_PROVIDER_SITE_OTHER): Payer: Self-pay | Admitting: Orthopaedic Surgery

## 2016-08-25 DIAGNOSIS — G8929 Other chronic pain: Secondary | ICD-10-CM

## 2016-08-25 DIAGNOSIS — M545 Low back pain: Principal | ICD-10-CM

## 2016-08-25 DIAGNOSIS — Z77018 Contact with and (suspected) exposure to other hazardous metals: Secondary | ICD-10-CM

## 2016-09-09 ENCOUNTER — Ambulatory Visit
Admission: RE | Admit: 2016-09-09 | Discharge: 2016-09-09 | Disposition: A | Discharge status: Home / Self Care | Payer: BLUE CROSS/BLUE SHIELD | Source: Ambulatory Visit | Attending: Orthopaedic Surgery | Admitting: Orthopaedic Surgery

## 2016-09-09 DIAGNOSIS — Z77018 Contact with and (suspected) exposure to other hazardous metals: Secondary | ICD-10-CM

## 2016-09-09 DIAGNOSIS — G8929 Other chronic pain: Secondary | ICD-10-CM

## 2016-09-09 DIAGNOSIS — M545 Low back pain: Principal | ICD-10-CM

## 2016-09-09 MED ORDER — GADOBENATE DIMEGLUMINE 529 MG/ML IV SOLN
16.0000 mL | Freq: Once | INTRAVENOUS | Status: AC | PRN
  Administered 2016-09-09: 16 mL via INTRAVENOUS

## 2016-09-21 ENCOUNTER — Ambulatory Visit (INDEPENDENT_AMBULATORY_CARE_PROVIDER_SITE_OTHER): Payer: BLUE CROSS/BLUE SHIELD | Admitting: Orthopaedic Surgery

## 2016-09-21 ENCOUNTER — Encounter (INDEPENDENT_AMBULATORY_CARE_PROVIDER_SITE_OTHER): Payer: Self-pay | Admitting: Orthopaedic Surgery

## 2016-09-21 VITALS — BP 118/83 | HR 78 | Ht 72.0 in | Wt 175.0 lb

## 2016-09-21 DIAGNOSIS — M9983 Other biomechanical lesions of lumbar region: Secondary | ICD-10-CM | POA: Diagnosis not present

## 2016-09-21 DIAGNOSIS — M48061 Spinal stenosis, lumbar region without neurogenic claudication: Secondary | ICD-10-CM

## 2016-09-21 MED ORDER — HYDROCODONE-ACETAMINOPHEN 5-325 MG PO TABS: 1.0000 | ORAL_TABLET | Freq: Four times a day (QID) | ORAL | 0 refills | Status: DC | PRN

## 2016-09-21 NOTE — Progress Notes (Signed)
Office Visit Note   Patient: [name]           Date of Birth: 29-Jul-1960           MRN: [id number] Visit Date: 09/21/2016              Requested by: Eartha Inch, MD [address] [place], [place] [address] PCP: Eartha Inch, MD   Assessment & Plan: Visit Diagnoses:  1. Lumbar foraminal stenosis             L4-5 biforaminal Stenosis with previous left L5-S1 microdiscectomy.  Plan: Patient was riding in a car or sitting he does well when he is moving activities able play golf and) retractor,, were. He does a lot of bending over he has increase pain. Current MRI shows by foraminal stenosis at L4-5 and his previous surgery was L5-S1 with a disc space is significantly narrowed he has some edema in the endplates from the degeneration at 52. He has some central disc bulge broadbase but no significant central stenosis and no claudication symptoms. I plan to recheck him in 2 months we discussed options with him including the 2 level instrumented fusion. At present I would continue staying active avoiding positions that tend to bother him. Recheck 2 months. He's had previous epidural that helped also had radiofrequency ablation of the facet joints which the first time gave him the many months of good relief and last time did not seem to work as well. He understands that ultimately 2 level fusion would be required. Prescription for Norco given whenever he has his wife drive when they go to [facility] to visit his children he can take half tablet or quarter tablet disease done in the past. Recheck 2 months.  Follow-Up Instructions: Return in about 2 months (around 11/22/2016).   Orders:  No orders of the defined types were placed in this encounter.  Meds ordered this encounter  Medications  . HYDROcodone-acetaminophen (NORCO/VICODIN) 5-325 MG tablet    Sig: Take 1 tablet by mouth every 6 (six) hours as needed for moderate pain.    Dispense:  30 tablet    Refill:  0      Procedures: No procedures performed   Clinical Data: No additional findings.   Subjective: Chief Complaint  Patient presents with  . Lower Back - Pain, Follow-up    HPI 56 year old male with back pain facet arthropathy L4-5 previous microdiscectomy on the left at L5-S1 done elsewhere with about 2 years before he could resume good activity level. He likes to play golf owns one golf course releases another.  Review of Systems positive for hypertension diabetes low-back pain times greater than 10 years. PAT, quit smoking 2008 otherwise negative as it pertains his history of present illness.   Objective: Vital Signs: BP 118/83   Pulse 78   Ht 6' (1.829 m)   Wt 175 lb (79.4 kg)   BMI 23.73 kg/m   Physical Exam  Constitutional: He is oriented to person, place, and time. He appears well-developed and well-nourished.  HENT:  Head: Normocephalic and atraumatic.  Eyes: Pupils are equal, round, and reactive to light. EOM are normal.  Neck: No tracheal deviation present. No thyromegaly present.  Cardiovascular: Normal rate.   Pulmonary/Chest: Effort normal. He has no wheezes.  Abdominal: Soft. Bowel sounds are normal.  Musculoskeletal: He exhibits no edema or deformity.  Patient is a straight leg raising 90 knee and ankle jerk 2+ and symmetrical normal hip range  of motion is reach full extension less than motor weakness. He gets from sitting to standing upright position he can heel and toe walk. Anterior tib EHL is normal negative Homan no atrophy no venous stasis changes no rash over exposed skin.  Neurological: He is alert and oriented to person, place, and time.  Skin: Skin is warm and dry. Capillary refill takes less than 2 seconds.  Psychiatric: He has a normal mood and affect. His behavior is normal. Judgment and thought content normal.    Ortho Exam minimal sciatic notch tenderness. Negative Faber test. He has pain with the sitting position and form flexion.  Specialty  Comments:  No specialty comments available.  Imaging: Show images for MR Lumbar Spine W Wo Contrast  Study Result   CLINICAL DATA:  Chronic low back pain which has worsened over the past 3 months. History of prior L5-S1 surgery approximately 6 years ago.  EXAM: MRI LUMBAR SPINE WITHOUT AND WITH CONTRAST  TECHNIQUE: Multiplanar and multiecho pulse sequences of the lumbar spine were obtained without and with intravenous contrast.  CONTRAST:  16 ml MULTIHANCE GADOBENATE DIMEGLUMINE 529 MG/ML IV SOLN  COMPARISON:  MRI lumbar spine 05/15/2008. Two views lumbar spine performed at [facility] 08/23/2016.  FINDINGS: Segmentation:  Standard.  Alignment: Trace anterolisthesis L1 on L2 is noted. Otherwise maintained.  Vertebrae: No fracture or worrisome lesion. Degenerative endplate signal change is present at L4-5 and L5-S1.  Conus medullaris: Extends to the L1 level and appears normal.  Paraspinal and other soft tissues: A few tiny renal cysts are seen bilaterally. Otherwise negative.  Disc levels:  T11-12 is imaged in the sagittal plane only and negative.  T12-L1:  Negative.  L1-2: Shallow disc bulge without central canal or foraminal narrowing, unchanged.  L2-3:  Negative.  L3-4:  Negative.  L4-5: Shallow broad-based right paracentral protrusion causes mild narrowing in the subarticular recesses. Disc in conjunction with facet degenerative change results in severe right and moderate left foraminal narrowing.  L5-S1: There is postoperative change on the left where the patient is status post discectomy. Shallow disc bulge to the left is identified but the central canal is open. The foramina are also patent.  IMPRESSION: Severe right and moderate left foraminal narrowing at L4-5 due to disc and facet degenerative change. Shallow broad-based right paracentral protrusion at this level causes mild narrowing in the subarticular recesses  without compression of the descending L5 roots identified.  Postoperative change of discectomy on the left at L5-S1. The central canal and foramina appear open at this level.   Electronically Signed   By: Drusilla Kanner M.D.   On: 09/09/2016 15:47        PMFS History: Patient Active Problem List   Diagnosis Date Noted  . HTN (hypertension) 09/28/2010  . PAF (paroxysmal atrial fibrillation) (HCC) 09/28/2010   Past Medical History:  Diagnosis Date  . Chronic back pain   . Diabetes mellitus   . Hyperlipidemia   . Hypertension   . PAF (paroxysmal atrial fibrillation) (HCC)   . Palpitation   . Ulcerative colitis     Family History  Problem Relation Age of Onset  . Arrhythmia Father   . Cardiomyopathy Father   . Hypertension Father     Past Surgical History:  Procedure Laterality Date  . CARDIOVASCULAR STRESS TEST  12/24/2008   EF 66% with no ischemia  . TRANSTHORACIC ECHOCARDIOGRAM  05/30/2007    EF 55%   Social History   Occupational History  . Not  on file.   Social History Main Topics  . Smoking status: Former Smoker    Quit date: 03/07/2006  . Smokeless tobacco: Never Used  . Alcohol use No  . Drug use: No  . Sexual activity: Yes

## 2016-11-22 ENCOUNTER — Ambulatory Visit (INDEPENDENT_AMBULATORY_CARE_PROVIDER_SITE_OTHER): Payer: BLUE CROSS/BLUE SHIELD | Admitting: Orthopaedic Surgery

## 2017-09-14 ENCOUNTER — Ambulatory Visit (INDEPENDENT_AMBULATORY_CARE_PROVIDER_SITE_OTHER): Payer: 59 | Admitting: Surgery

## 2017-09-14 ENCOUNTER — Ambulatory Visit (INDEPENDENT_AMBULATORY_CARE_PROVIDER_SITE_OTHER): Payer: 59

## 2017-09-14 VITALS — BP 110/71 | HR 71 | Ht 72.0 in | Wt 183.0 lb

## 2017-09-14 DIAGNOSIS — G8929 Other chronic pain: Secondary | ICD-10-CM | POA: Diagnosis not present

## 2017-09-14 DIAGNOSIS — M25561 Pain in right knee: Secondary | ICD-10-CM

## 2017-09-14 NOTE — Progress Notes (Signed)
Office Visit Note   Patient: [name]           Date of Birth: 1960-07-26           MRN: [id number] Visit Date: 09/14/2017              Requested by: William Inch, MD [address] [place], [place] [address] PCP: William Inch, MD   Assessment & Plan: Visit Diagnoses:  1. Chronic pain of right knee     Plan: In hopes of giving patient some relief of his pain offered injection.  Patient sent right knee was prepped with Betadine and intra-articular Marcaine/Depo-Medrol injection was performed.  Patient will follow-up with me in 3 weeks for recheck but I did advise him to call in 2 weeks to let me know how he was feeling.  If he still continues to have pain and feeling of mechanical symptoms I will plan to schedule MRI to rule out meniscal tear.  If he is doing better we can cancel that appointment.  Patient comfortable with today's treatment plan.  May ice the knee off and on if needed.  Can continue Aleve as needed as directed.  Follow-Up Instructions: Return in about 3 weeks (around 10/05/2017) for With Fayrene Fearing for recheck right knee.   Orders:  Orders Placed This Encounter  Procedures  . XR Knee 1-2 Views Right   No orders of the defined types were placed in this encounter.     Procedures: Large Joint Inj: R knee on 09/14/2017 6:01 PM Indications: pain Details: 25 G 1.5 in needle, anteromedial approach Medications: 3 mL lidocaine 1 %; 40 mg methylPREDNISolone acetate 40 MG/ML; 6 mL bupivacaine 0.25 % Outcome: tolerated well, no immediate complications Patient was prepped and draped in the usual sterile fashion.       Clinical Data: No additional findings.   Subjective: Chief Complaint  Patient presents with  . Right Knee - Pain    HPI 57 year old white male comes in with complaints of right medial knee pain.  States the pain is been ongoing for a couple years but worse the last 3 months.  No specific injury that he can recall.  Pain aggravated with  squatting, pivoting and getting down on his knee with work.  Has had some slight swelling.  Has taken Aleve as needed and wears a knee sleeve.  Some feeling of catching and popping but no true instability.  No lumbar, hip or radicular component. Review of Systems No current cardiac pulmonary GI GU issues  Objective: Vital Signs: BP 110/71 (BP Location: Left Arm, Patient Position: Sitting, Cuff Size: Normal)   Pulse 71   Ht 6' (1.829 m)   Wt 183 lb (83 kg)   BMI 24.82 kg/m   Physical Exam  Constitutional: He is oriented to person, place, and time. He appears well-developed and well-nourished. No distress.  HENT:  Head: Normocephalic and atraumatic.  Eyes: Pupils are equal, round, and reactive to light. EOM are normal.  Neck: Normal range of motion.  Pulmonary/Chest: No respiratory distress.  Abdominal: He exhibits no distension.  Musculoskeletal:  Gait is somewhat antalgic.  Negative logroll bilateral hips.  Negative straight leg raise.  Right knee good range of motion.  Somewhat tender medial plica.  Mildly tender over the medial joint line.  Mild discomfort with McMurray's testing.  Cruciate collateral ligaments are stable.  Some knee swelling without large effusion.  Calf nontender.  Neurovascular intact.  Neurological: He is alert and  oriented to person, place, and time.  Skin: Skin is warm and dry.    Ortho Exam  Specialty Comments:  No specialty comments available.  Imaging: No results found.   PMFS History: Patient Active Problem List   Diagnosis Date Noted  . HTN (hypertension) 09/28/2010  . PAF (paroxysmal atrial fibrillation) (HCC) 09/28/2010   Past Medical History:  Diagnosis Date  . Chronic back pain   . Diabetes mellitus   . Hyperlipidemia   . Hypertension   . PAF (paroxysmal atrial fibrillation) (HCC)   . Palpitation   . Ulcerative colitis     Family History  Problem Relation Age of Onset  . Arrhythmia Father   . Cardiomyopathy Father   .  Hypertension Father     Past Surgical History:  Procedure Laterality Date  . CARDIOVASCULAR STRESS TEST  12/24/2008   EF 66% with no ischemia  . TRANSTHORACIC ECHOCARDIOGRAM  05/30/2007    EF 55%   Social History   Occupational History  . Not on file  Tobacco Use  . Smoking status: Former Smoker    Last attempt to quit: 03/07/2006    Years since quitting: 11.5  . Smokeless tobacco: Never Used  Substance and Sexual Activity  . Alcohol use: No  . Drug use: No  . Sexual activity: Yes

## 2017-10-11 ENCOUNTER — Ambulatory Visit (INDEPENDENT_AMBULATORY_CARE_PROVIDER_SITE_OTHER): Payer: 59 | Admitting: Surgery

## 2017-10-11 ENCOUNTER — Encounter (INDEPENDENT_AMBULATORY_CARE_PROVIDER_SITE_OTHER): Payer: Self-pay | Admitting: Surgery

## 2017-10-11 VITALS — BP 116/78 | HR 74 | Ht 72.0 in | Wt 183.0 lb

## 2017-10-11 DIAGNOSIS — G8929 Other chronic pain: Secondary | ICD-10-CM

## 2017-10-11 DIAGNOSIS — M25561 Pain in right knee: Secondary | ICD-10-CM

## 2017-10-11 NOTE — Progress Notes (Signed)
   Office Visit Note   Patient: [name]           Date of Birth: 02/06/1961           MRN: [id number] Visit Date: 10/11/2017              Requested by: [name], [name], MD [address] [place], [place] [id number] PCP: [name], [name], MD   Assessment & Plan: Visit Diagnoses:  1. Chronic pain of right knee   Resolved mechanical symptoms. Symptomatic medial plica  Plan:   He will avoid putting direct pressure onto the right knee.  Follow-up as needed.  Within the next 6 to 8 weeks if his symptoms return he will call to let me know and I will schedule MRI to rule out medial meniscus tear and to evaluate medial plica.  Follow-Up Instructions: Return if symptoms worsen or fail to improve.   Orders:  No orders of the defined types were placed in this encounter.  No orders of the defined types were placed in this encounter.     Procedures: No procedures performed   Clinical Data: No additional findings.   Subjective: Chief Complaint  Patient presents with  . Right Knee - Follow-up    Doing much better    HPI 57 year old white male returns for recheck of his right knee.  States that he is doing a lot better.  He is pleased after having injection.  Still has some intermittent medial knee pain when he gets down on his knee doing his job.  Not as bad as previous visit. Review of Systems No current cardiac pulmonary GI GU issues  Objective: Vital Signs: BP 116/78 (BP Location: Left Arm, Patient Position: Sitting)   Pulse 74   Ht 6' (1.829 m)   Wt 183 lb (83 kg)   BMI 24.82 kg/m   Physical Exam  Ortho Exam  Specialty Comments:  No specialty comments available.  Imaging: No results found.   PMFS History: Patient Active Problem List   Diagnosis Date Noted  . HTN (hypertension) 09/28/2010  . PAF (paroxysmal atrial fibrillation) (HCC) 09/28/2010   Past Medical History:  Diagnosis Date  . Chronic back pain   . Diabetes mellitus   . Hyperlipidemia    . Hypertension   . PAF (paroxysmal atrial fibrillation) (HCC)   . Palpitation   . Ulcerative colitis     Family History  Problem Relation Age of Onset  . Arrhythmia Father   . Cardiomyopathy Father   . Hypertension Father     Past Surgical History:  Procedure Laterality Date  . CARDIOVASCULAR STRESS TEST  12/24/2008   EF 66% with no ischemia  . TRANSTHORACIC ECHOCARDIOGRAM  05/30/2007    EF 55%   Social History   Occupational History  . Not on file  Tobacco Use  . Smoking status: Former Smoker    Last attempt to quit: 03/07/2006    Years since quitting: 11.6  . Smokeless tobacco: Never Used  Substance and Sexual Activity  . Alcohol use: No  . Drug use: No  . Sexual activity: Yes

## 2017-11-07 ENCOUNTER — Telehealth (INDEPENDENT_AMBULATORY_CARE_PROVIDER_SITE_OTHER): Payer: Self-pay | Admitting: Physical Medicine and Rehabilitation

## 2017-11-07 NOTE — Telephone Encounter (Signed)
Called patient to advise  °

## 2017-11-07 NOTE — Telephone Encounter (Signed)
The notification/prior authorization case information was transmitted on 11/07/2017 at 10:40 AM CDT. The notification/prior authorization reference number is [id number]

## 2017-11-07 NOTE — Telephone Encounter (Signed)
Needs auth for repeat bilateral RFA- both on same day.

## 2017-11-07 NOTE — Telephone Encounter (Signed)
I would do anything stronger than hydrocodone so he may need to ask whomever is providing that what there thoughts are, could see him in OV

## 2017-11-07 NOTE — Telephone Encounter (Signed)
Depending on insurance would repeat, if insurance issue then OV then repeat

## 2017-11-09 ENCOUNTER — Telehealth (INDEPENDENT_AMBULATORY_CARE_PROVIDER_SITE_OTHER): Payer: Self-pay | Admitting: *Deleted

## 2017-11-09 NOTE — Telephone Encounter (Signed)
Submitted more clinical notes to [employer] website, case is pending.   Your Notification/Prior Authorization Update Is Complete!  The notification/prior authorization case information was transmitted on 11/09/2017 at 8:34 AM CDT. The notification/prior authorization reference number is [id number].   

## 2017-11-09 NOTE — Telephone Encounter (Signed)
Submitted more clinical notes to [facility] website, case is pending.   Your Notification/Prior Authorization Update Is Complete!  The notification/prior authorization case information was transmitted on 11/09/2017 at 8:34 AM CDT. The notification/prior authorization reference number is [id number].

## 2017-11-16 NOTE — Telephone Encounter (Signed)
[facility]/PRIOR AUTHORIZATION NUMBER [id number] Approved

## 2017-12-05 ENCOUNTER — Ambulatory Visit (INDEPENDENT_AMBULATORY_CARE_PROVIDER_SITE_OTHER): Payer: Self-pay

## 2017-12-05 ENCOUNTER — Telehealth (INDEPENDENT_AMBULATORY_CARE_PROVIDER_SITE_OTHER): Payer: Self-pay | Admitting: *Deleted

## 2017-12-05 ENCOUNTER — Ambulatory Visit (INDEPENDENT_AMBULATORY_CARE_PROVIDER_SITE_OTHER): Payer: 59 | Admitting: Physical Medicine and Rehabilitation

## 2017-12-05 ENCOUNTER — Encounter (INDEPENDENT_AMBULATORY_CARE_PROVIDER_SITE_OTHER): Payer: Self-pay | Admitting: Physical Medicine and Rehabilitation

## 2017-12-05 VITALS — BP 109/73 | HR 70 | Temp 98.4°F

## 2017-12-05 DIAGNOSIS — M47816 Spondylosis without myelopathy or radiculopathy, lumbar region: Secondary | ICD-10-CM | POA: Diagnosis not present

## 2017-12-05 MED ORDER — ACETAMINOPHEN-CODEINE #3 300-30 MG PO TABS: 1.0000 | ORAL_TABLET | Freq: Three times a day (TID) | ORAL | 0 refills | Status: DC | PRN

## 2017-12-05 MED ORDER — METHYLPREDNISOLONE ACETATE 80 MG/ML IJ SUSP
80.0000 mg | Freq: Once | INTRAMUSCULAR | Status: AC
Start: 2017-12-05 — End: 2017-12-05
  Administered 2017-12-05: 80 mg

## 2017-12-05 NOTE — Progress Notes (Signed)
.  Numeric Pain Rating Scale and Functional Assessment Average Pain 4   In the last MONTH (on 0-10 scale) has pain interfered with the following?  1. General activity like being  able to carry out your everyday physical activities such as walking, climbing stairs, carrying groceries, or moving a chair?  Rating(3)   +Driver, -BT, -Dye Allergies.  

## 2017-12-05 NOTE — Telephone Encounter (Signed)
Please advise. Per med list, you last prescribed 09/21/16.

## 2017-12-05 NOTE — Telephone Encounter (Signed)
Sorry , I have not seen him in over one year. Unable to prescribe narcotics. He can make appt for repeat eval.

## 2017-12-05 NOTE — Telephone Encounter (Signed)
Please see below. If patient needs pain medication, Dr. Alvester Morin will need to address.

## 2017-12-05 NOTE — Telephone Encounter (Signed)
Please advise. Med list shows you last prescribed in 2018.

## 2017-12-11 ENCOUNTER — Telehealth (INDEPENDENT_AMBULATORY_CARE_PROVIDER_SITE_OTHER): Payer: Self-pay | Admitting: Surgery

## 2017-12-11 DIAGNOSIS — M25561 Pain in right knee: Principal | ICD-10-CM

## 2017-12-11 DIAGNOSIS — G8929 Other chronic pain: Secondary | ICD-10-CM

## 2017-12-11 NOTE — Telephone Encounter (Signed)
Please see below. I entered order for MRI per your last office note.  I called patient and advised.  Please advise who you would like patient to follow up with to review MRI.

## 2017-12-11 NOTE — Telephone Encounter (Signed)
Patient was seen by Fayrene Fearing in August for his right knee, he said the cortisone injection helped for a little bit but now the pain is back, he is wondering if an MRI can be ordered for him per Fayrene Fearing? Patients # [phone]

## 2017-12-13 NOTE — Procedures (Signed)
Lumbar Facet Joint Nerve Denervation  Patient: [name]      Date of Birth: 12/01/1960 MRN: [id number] PCP: Eartha Inch, MD      Visit Date: 12/05/2017   Universal Protocol:    Date/Time: 10/09/196:19 AM  Consent Given By: the patient  Position: PRONE  Additional Comments: Vital signs were monitored before and after the procedure. Patient was prepped and draped in the usual sterile fashion. The correct patient, procedure, and site was verified.   Injection Procedure Details:  Procedure Site One Meds Administered:  Meds ordered this encounter  Medications  . acetaminophen-codeine (TYLENOL #3) 300-30 MG tablet    Sig: Take 1 tablet by mouth every 8 (eight) hours as needed for up to 7 days for moderate pain.    Dispense:  21 tablet    Refill:  0  . methylPREDNISolone acetate (DEPO-MEDROL) injection 80 mg     Laterality: Bilateral  Location/Site:  L5-S1  Needle size: 18 G  Needle type: Radiofrequency cannula  Needle Placement: Along juncture of superior articular process and transverse pocess  Findings:  -Comments:  Procedure Details: For each desired target nerve, the corresponding transverse process (sacral ala for the L5 dorsal rami) was identified and the fluoroscope was positioned to square off the endplates of the corresponding vertebral body to achieve a true AP midline view.  The beam was then obliqued 15 to 20 degrees and caudally tilted 15 to 20 degrees to line up a trajectory along the target nerves. The skin over the target of the junction of superior articulating process and transverse process (sacral ala for the L5 dorsal rami) was infiltrated with 1ml of 1% Lidocaine without Epinephrine.  The 18 gauge 10mm active tip outer cannula was advanced in trajectory view to the target.  This procedure was repeated for each target nerve.  Then, for all levels, the outer cannula placement was fine-tuned and the position was then confirmed with bi-planar  imaging.    Test stimulation was done both at sensory and motor levels to ensure there was no radicular stimulation. The target tissues were then infiltrated with 1 ml of 1% Lidocaine without Epinephrine. Subsequently, a percutaneous neurotomy was carried out for 60 seconds at 80 degrees Celsius. The procedure was repeated with the cannula rotated 90 degrees, for duration of 60 seconds, one additional time at each level for a total of two lesions per level.  After the completion of the two lesions, 1 ml of injectate was delivered. It was then repeated for each facet joint nerve mentioned above. Appropriate radiographs were obtained to verify the probe placement during the neurotomy.   Additional Comments:  The patient tolerated the procedure well Dressing: Band-Aid    Post-procedure details: Patient was observed during the procedure. Post-procedure instructions were reviewed.  Patient left the clinic in stable condition.

## 2017-12-13 NOTE — Progress Notes (Signed)
[name] - 57 y.o. male MRN [id number]  Date of birth: 03/16/1960  Office Visit Note: Visit Date: 12/05/2017 PCP: Eartha Inch, MD Referred by: Eartha Inch, MD  Subjective: Chief Complaint  Patient presents with  . Lower Back - Pain   HPI: [name] is a 57 y.o. male who comes in today Planned radiofrequency ablation of the bilateral L5-S1 facet joints.  His history can be reviewed in the chart for the last saw Dr. Annell Greening in July of this year.  He has been followed mainly by Dr. Ophelia Charter over the last several years.  I saw [name] approximately 2 years ago and completed a second radiofrequency ablation of his L5-S1 facet joints.  Prior to that he had gone through chronic management of back pain including opioid management as well as physical therapy and prior lumbar discectomy remotely by Dr. Wyline Mood.  Double diagnostic blocks at the time gave him good relief and we went on to proceed with radiofrequency ablation of the L5-S1 facet joints and he had remarkable relief for over a year the first time and approximately a year or so the second time.  He is really has been putting up with the pain over the last year and a half.  He followed up with Dr. Kevan Ny who suggested two-level fusion.  Updated MRI again shows changes at both levels with more arthropathy at L4-5 and in the past.  He has degenerative changes as well as disc bulging but no real stenosis.  He denies any radicular leg pain and has axial pain worse with standing.  Moving around and getting looser seems to help.  He reports good relief with last ablation procedure.  We are going to repeat that today it is been again over 2 years since I saw him.  Depending on relief would look at potential diagnostic blocks at L4-5 which she has worsening arthropathy there as well.  He will continue to follow with Dr. Ophelia Charter as well.  He is failed all other manner of conservative care.  He could be a candidate for spinal cord stimulator  trial as well.  He did ask about pain medication today and I did give him a small bit of Tylenol 3.  In the request for stronger medication will probably need to go through Dr. Ophelia Charter and if that is the main issue he may be a candidate for more comprehensive pain management program.  ROS Otherwise per HPI.  Assessment & Plan: Visit Diagnoses:  1. Spondylosis without myelopathy or radiculopathy, lumbar region     Plan: No additional findings.   Meds & Orders:  Meds ordered this encounter  Medications  . acetaminophen-codeine (TYLENOL #3) 300-30 MG tablet    Sig: Take 1 tablet by mouth every 8 (eight) hours as needed for up to 7 days for moderate pain.    Dispense:  21 tablet    Refill:  0  . methylPREDNISolone acetate (DEPO-MEDROL) injection 80 mg    Orders Placed This Encounter  Procedures  . Radiofrequency,Lumbar  . XR C-ARM NO REPORT    Follow-up: No follow-ups on file.   Procedures: No procedures performed  Lumbar Facet Joint Nerve Denervation  Patient: [name]      Date of Birth: [birth year] MRN: [id number] PCP: Eartha Inch, MD      Visit Date: 12/05/2017   Universal Protocol:    Date/Time: 10/09/196:19 AM  Consent Given By: the patient  Position: PRONE  Additional  Comments: Vital signs were monitored before and after the procedure. Patient was prepped and draped in the usual sterile fashion. The correct patient, procedure, and site was verified.   Injection Procedure Details:  Procedure Site One Meds Administered:  Meds ordered this encounter  Medications  . acetaminophen-codeine (TYLENOL #3) 300-30 MG tablet    Sig: Take 1 tablet by mouth every 8 (eight) hours as needed for up to 7 days for moderate pain.    Dispense:  21 tablet    Refill:  0  . methylPREDNISolone acetate (DEPO-MEDROL) injection 80 mg     Laterality: Bilateral  Location/Site:  L5-S1  Needle size: 18 G  Needle type: Radiofrequency cannula  Needle Placement: Along  juncture of superior articular process and transverse pocess  Findings:  -Comments:  Procedure Details: For each desired target nerve, the corresponding transverse process (sacral ala for the L5 dorsal rami) was identified and the fluoroscope was positioned to square off the endplates of the corresponding vertebral body to achieve a true AP midline view.  The beam was then obliqued 15 to 20 degrees and caudally tilted 15 to 20 degrees to line up a trajectory along the target nerves. The skin over the target of the junction of superior articulating process and transverse process (sacral ala for the L5 dorsal rami) was infiltrated with 1ml of 1% Lidocaine without Epinephrine.  The 18 gauge 10mm active tip outer cannula was advanced in trajectory view to the target.  This procedure was repeated for each target nerve.  Then, for all levels, the outer cannula placement was fine-tuned and the position was then confirmed with bi-planar imaging.    Test stimulation was done both at sensory and motor levels to ensure there was no radicular stimulation. The target tissues were then infiltrated with 1 ml of 1% Lidocaine without Epinephrine. Subsequently, a percutaneous neurotomy was carried out for 60 seconds at 80 degrees Celsius. The procedure was repeated with the cannula rotated 90 degrees, for duration of 60 seconds, one additional time at each level for a total of two lesions per level.  After the completion of the two lesions, 1 ml of injectate was delivered. It was then repeated for each facet joint nerve mentioned above. Appropriate radiographs were obtained to verify the probe placement during the neurotomy.   Additional Comments:  The patient tolerated the procedure well Dressing: Band-Aid    Post-procedure details: Patient was observed during the procedure. Post-procedure instructions were reviewed.  Patient left the clinic in stable condition.      Clinical History: MRI LUMBAR SPINE  WITHOUT AND WITH CONTRAST  TECHNIQUE: Multiplanar and multiecho pulse sequences of the lumbar spine were obtained without and with intravenous contrast.  CONTRAST:  16 ml MULTIHANCE GADOBENATE DIMEGLUMINE 529 MG/ML IV SOLN  COMPARISON:  MRI lumbar spine 05/15/2008. Two views lumbar spine performed at [facility] 08/23/2016.  FINDINGS: Segmentation:  Standard.  Alignment: Trace anterolisthesis L1 on L2 is noted. Otherwise maintained.  Vertebrae: No fracture or worrisome lesion. Degenerative endplate signal change is present at L4-5 and L5-S1.  Conus medullaris: Extends to the L1 level and appears normal.  Paraspinal and other soft tissues: A few tiny renal cysts are seen bilaterally. Otherwise negative.  Disc levels:  T11-12 is imaged in the sagittal plane only and negative.  T12-L1:  Negative.  L1-2: Shallow disc bulge without central canal or foraminal narrowing, unchanged.  L2-3:  Negative.  L3-4:  Negative.  L4-5: Shallow broad-based right paracentral protrusion causes mild narrowing  in the subarticular recesses. Disc in conjunction with facet degenerative change results in severe right and moderate left foraminal narrowing.  L5-S1: There is postoperative change on the left where the patient is status post discectomy. Shallow disc bulge to the left is identified but the central canal is open. The foramina are also patent.  IMPRESSION: Severe right and moderate left foraminal narrowing at L4-5 due to disc and facet degenerative change. Shallow broad-based right paracentral protrusion at this level causes mild narrowing in the subarticular recesses without compression of the descending L5 roots identified.  Postoperative change of discectomy on the left at L5-S1. The central canal and foramina appear open at this level.   Electronically Signed   By: Drusilla Kanner M.D.   On: 09/09/2016 15:47   He reports that he quit  smoking about 11 years ago. He has never used smokeless tobacco. No results for input(s): HGBA1C, LABURIC in the last 8760 hours.  Objective:  VS:  HT:    WT:   BMI:     BP:109/73  HR:70bpm  TEMP:98.4 F (36.9 C)(Oral)  RESP:  Physical Exam  Musculoskeletal:  Patient is slow to rise from a seated position to full extension.  He has pain with facet joint loading on exam bilaterally.  He has no pain over the greater trochanters and good distal strength.    Ortho Exam Imaging: No results found.  Past Medical/Family/Surgical/Social History: Medications & Allergies reviewed per EMR, new medications updated. Patient Active Problem List   Diagnosis Date Noted  . HTN (hypertension) 09/28/2010  . PAF (paroxysmal atrial fibrillation) (HCC) 09/28/2010   Past Medical History:  Diagnosis Date  . Chronic back pain   . Diabetes mellitus   . Hyperlipidemia   . Hypertension   . PAF (paroxysmal atrial fibrillation) (HCC)   . Palpitation   . Ulcerative colitis    Family History  Problem Relation Age of Onset  . Arrhythmia Father   . Cardiomyopathy Father   . Hypertension Father    Past Surgical History:  Procedure Laterality Date  . CARDIOVASCULAR STRESS TEST  12/24/2008   EF 66% with no ischemia  . TRANSTHORACIC ECHOCARDIOGRAM  05/30/2007    EF 55%   Social History   Occupational History  . Not on file  Tobacco Use  . Smoking status: Former Smoker    Last attempt to quit: 03/07/2006    Years since quitting: 11.7  . Smokeless tobacco: Never Used  Substance and Sexual Activity  . Alcohol use: No  . Drug use: No  . Sexual activity: Yes

## 2017-12-14 ENCOUNTER — Encounter (INDEPENDENT_AMBULATORY_CARE_PROVIDER_SITE_OTHER): Payer: Self-pay | Admitting: Physical Medicine and Rehabilitation

## 2017-12-18 ENCOUNTER — Ambulatory Visit
Admission: RE | Admit: 2017-12-18 | Discharge: 2017-12-18 | Disposition: A | Discharge status: Home / Self Care | Payer: 59 | Source: Ambulatory Visit | Attending: Surgery | Admitting: Surgery

## 2017-12-18 DIAGNOSIS — M25561 Pain in right knee: Principal | ICD-10-CM

## 2017-12-18 DIAGNOSIS — G8929 Other chronic pain: Secondary | ICD-10-CM

## 2018-01-05 NOTE — Telephone Encounter (Signed)
Please have patient schedule ROV with Dr August Saucer to review MRI results per Fayrene Fearing. Thanks.

## 2018-01-05 NOTE — Telephone Encounter (Signed)
With Dr. August Saucer.  Thanks

## 2018-01-08 NOTE — Telephone Encounter (Signed)
Patient did state that he didn't mind seeing August Saucer or Ophelia Charter, He informed me he didn't want to wait until the 13th to see Dr.Dean.

## 2018-01-09 ENCOUNTER — Ambulatory Visit (INDEPENDENT_AMBULATORY_CARE_PROVIDER_SITE_OTHER): Payer: 59 | Admitting: Orthopaedic Surgery

## 2018-01-09 ENCOUNTER — Encounter (INDEPENDENT_AMBULATORY_CARE_PROVIDER_SITE_OTHER): Payer: Self-pay | Admitting: Orthopaedic Surgery

## 2018-01-09 VITALS — BP 118/75 | HR 75 | Ht 72.0 in | Wt 183.0 lb

## 2018-01-09 DIAGNOSIS — S83241D Other tear of medial meniscus, current injury, right knee, subsequent encounter: Secondary | ICD-10-CM

## 2018-01-09 NOTE — Progress Notes (Signed)
Office Visit Note   Patient: [name]           Date of Birth: 06-27-1960           MRN: [id number] Visit Date: 01/09/2018              Requested by: Eartha Inch, MD [address] [place], [place] [address] PCP: Eartha Inch, MD   Assessment & Plan: Visit Diagnoses:  1. Acute medial meniscus tear of right knee, subsequent encounter     Plan: Patient has symptomatic posterior medial meniscal tear with repetitive catching problems with working on the job with knee flexion.  Inflamed chronic medial plica which is symptomatic.  Lateral compartment is unremarkable.  Plan the Pleak excision medially and partial medial meniscectomy.  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  Procedure discussed wrist surgery discussed postoperative care usual length of time out of work.  Questions elicited and answered he understands request to proceed.  Follow-Up Instructions: No follow-ups on file.   Orders:  No orders of the defined types were placed in this encounter.  No orders of the defined types were placed in this encounter.     Procedures: No procedures performed   Clinical Data: No additional findings.   Subjective: Chief Complaint  Patient presents with  . Right Knee - Follow-up  . MRI REVIEW    HPI 56 year old male returns with ongoing problems with his right knee.  He has been treated with anti-inflammatories is had persistent catching his knee sharp pain when he tries to bend down when he is at work.  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  He is been limping his knee has been catching.  He is had intra-articular cortisone injection that gave him temporary relief an MRI scan shows posterior complex medial meniscal tear with associated multi loculated medial meniscal para labral cyst and medial plica.  Review of  Systems 14 point system update unchanged from 09/21/2016 office visit other than as mentioned above.  History of hypertension, diabetes low back pain.  Previous smoker.   Objective: Vital Signs: BP 118/75   Pulse 75   Ht 6' (1.829 m)   Wt 183 lb (83 kg)   BMI 24.82 kg/m   Physical Exam  Constitutional: He is oriented to person, place, and time. He appears well-developed and well-nourished.  HENT:  Head: Normocephalic and atraumatic.  Eyes: Pupils are equal, round, and reactive to light. EOM are normal.  Neck: No tracheal deviation present. No thyromegaly present.  Cardiovascular: Normal rate.  Pulmonary/Chest: Effort normal. He has no wheezes.  Abdominal: Soft. Bowel sounds are normal.  Neurological: He is alert and oriented to person, place, and time.  Skin: Skin is warm and dry. Capillary refill takes less than 2 seconds.  Psychiatric: He has a normal mood and affect. His behavior is normal. Judgment and thought content normal.    Ortho Exam normal hip range of motion sensation is intact.  Patient has pain with hyperextension pain with the medial joint line right knee with tenderness posterior medially.  Medial plica which is tender with palpation and extension.  Opposite knee shows no plica.  Posterior medial fullness consistent with his Baker's cyst.  Distal pulses are intact.  No sciatic notch tenderness.  Specialty Comments:  No specialty comments available.  Imaging: CLINICAL DATA:  Right knee pain with walking for approximately 18 months. No known injury.  EXAM: MRI OF THE RIGHT KNEE WITHOUT CONTRAST  TECHNIQUE: Multiplanar, multisequence MR imaging of the knee was performed. No intravenous contrast was administered.  COMPARISON:  None.  FINDINGS: MENISCI  Medial meniscus: The patient has a large horizontal tear in the posterior horn of the medial meniscus reaching the meniscal undersurface and extending into the midbody. There is an  associated parameniscal cyst centered at the junction of the posterior horn and body. Discrete measurement is not possible but the cyst measures up to 2.5 cm craniocaudal along the posterior aspect of the medial joint line by 1.9 cm transverse by 1.6 cm AP. No displaced fragment.  Lateral meniscus:  Intact.  LIGAMENTS  Cruciates:  Intact.  Collaterals:  Intact.  CARTILAGE  Patellofemoral:  Preserved.  The patient has a medial plica.  Medial:  Preserved.  Lateral:  Preserved.  Joint:  Trace amount of joint fluid.  Popliteal Fossa:  No Baker's cyst.  Extensor Mechanism:  Intact.  Bones:  Normal marrow signal throughout.  Other: None.  IMPRESSION: Large horizontal tear posterior horn and body of the medial meniscus with an associated parameniscal cyst.  The patient has a medial plica but no chondromalacia patella is identified.   Electronically Signed   By: Drusilla Kanner M.D.   On: 12/19/2017 07:57   PMFS History: Patient Active Problem List   Diagnosis Date Noted  . HTN (hypertension) 09/28/2010  . PAF (paroxysmal atrial fibrillation) (HCC) 09/28/2010   Past Medical History:  Diagnosis Date  . Chronic back pain   . Diabetes mellitus   . Hyperlipidemia   . Hypertension   . PAF (paroxysmal atrial fibrillation) (HCC)   . Palpitation   . Ulcerative colitis     Family History  Problem Relation Age of Onset  . Arrhythmia Father   . Cardiomyopathy Father   . Hypertension Father     Past Surgical History:  Procedure Laterality Date  . CARDIOVASCULAR STRESS TEST  12/24/2008   EF 66% with no ischemia  . TRANSTHORACIC ECHOCARDIOGRAM  05/30/2007    EF 55%   Social History   Occupational History  . Not on file  Tobacco Use  . Smoking status: Former Smoker    Last attempt to quit: 03/07/2006    Years since quitting: 11.8  . Smokeless tobacco: Never Used  Substance and Sexual Activity  . Alcohol use: No  . Drug use: No  . Sexual  activity: Yes

## 2018-01-25 ENCOUNTER — Telehealth (INDEPENDENT_AMBULATORY_CARE_PROVIDER_SITE_OTHER): Payer: Self-pay | Admitting: Orthopaedic Surgery

## 2018-01-25 NOTE — Telephone Encounter (Signed)
OK THANK YOU

## 2018-01-25 NOTE — Telephone Encounter (Signed)
fyi

## 2018-01-25 NOTE — Telephone Encounter (Signed)
I called patient to a discuss surgery date for right knee.  He states that since the cortisone injection he is doing better.  He will contact me after the first of the year if it starts giving him a problem again.

## 2018-06-12 MED ORDER — METHYLPREDNISOLONE ACETATE 40 MG/ML IJ SUSP
40.0000 mg | INTRAMUSCULAR | Status: AC | PRN
  Administered 2017-09-14: 40 mg via INTRA_ARTICULAR

## 2018-06-12 MED ORDER — BUPIVACAINE HCL 0.25 % IJ SOLN
6.0000 mL | INTRAMUSCULAR | Status: AC | PRN
  Administered 2017-09-14: 6 mL via INTRA_ARTICULAR

## 2018-06-12 MED ORDER — LIDOCAINE HCL 1 % IJ SOLN
3.0000 mL | INTRAMUSCULAR | Status: AC | PRN
Start: 2017-09-14 — End: 2017-09-14
  Administered 2017-09-14: 3 mL

## 2018-10-02 ENCOUNTER — Telehealth: Payer: Self-pay | Admitting: Cardiology

## 2018-10-02 NOTE — Telephone Encounter (Signed)
Called patient to schedule Recallfrom 2018 and he states that he is following another Cardiologist at this time and did not wish to schedule.

## 2018-10-08 ENCOUNTER — Ambulatory Visit (INDEPENDENT_AMBULATORY_CARE_PROVIDER_SITE_OTHER): Payer: 59 | Admitting: Surgery

## 2018-10-08 ENCOUNTER — Ambulatory Visit: Payer: Self-pay

## 2018-10-08 ENCOUNTER — Encounter: Payer: Self-pay | Admitting: Surgery

## 2018-10-08 ENCOUNTER — Other Ambulatory Visit: Payer: Self-pay

## 2018-10-08 VITALS — Ht 72.0 in | Wt 187.0 lb

## 2018-10-08 DIAGNOSIS — M25512 Pain in left shoulder: Secondary | ICD-10-CM

## 2018-10-08 DIAGNOSIS — M7542 Impingement syndrome of left shoulder: Secondary | ICD-10-CM | POA: Diagnosis not present

## 2018-10-08 DIAGNOSIS — G8929 Other chronic pain: Secondary | ICD-10-CM | POA: Diagnosis not present

## 2018-10-08 NOTE — Progress Notes (Signed)
Office Visit Note   Patient: [name]           Date of Birth: 11/30/1960           MRN: [id number] Visit Date: 10/08/2018              Requested by: [name], [name], MD [address] [place],  [place] [id number] PCP: [name], [name], MD   Assessment & Plan: Visit Diagnoses:  1. Chronic left shoulder pain   2. Pain in left acromioclavicular joint   3. Impingement syndrome of left shoulder     Plan: With the pain that patient is describing.  I asked Dr. [name] to perform an ultrasound-guided left [facility] Marcaine/Depo-Medrol injection.  Blood sugar in the office today 178.  I advised patient that he was strictly watch his diet and monitor his blood sugars over the next couple of days.  He understands the risk of his blood sugars elevating from the injection.  Follow-up with me in 2 weeks for recheck.  May consider trying a subacromial injection depending on his response to the [facility] joint injection.  All questions answered.  Follow-Up Instructions: Return in about 2 weeks (around 10/22/2018) for [facility] rec[facility]ck left shoulder.   Orders:  Orders Placed This Encounter  Procedures  . XR Shoulder Left   No orders of the defined types were placed in this encounter.     Procedures: No procedures performed   Clinical Data: No additional findings.   Subjective: Chief Complaint  Patient presents with  . Left Shoulder - Pain    HPI 58 year old white male comes in today with complaints of left shoulder pain.  Pain ongoing about 6 months.  No injury.  No previous problems for onset.  Pain when he reaches across his body and pushes things away from him.  Minimal to no discomfort reaching overhead or behind his back.  Localizes most of discomfort to the [facility] joint.  No cervical spine radicular component.  Right shoulder unremarkable. Review of Systems  Constitutional: Negative.   HENT: Negative.   Respiratory: Negative.   Gastrointestinal: Negative.   Musculoskeletal:  Positive for joint swelling.  Neurological: Negative.   Psychiatric/Behavioral: Negative.      Objective: Vital Signs: Ht 6' (1.829 m)   Wt 187 lb (84.8 kg)   BMI 25.36 kg/m   Physical Exam HENT:     Head: Normocephalic and atraumatic.  Eyes:     Extraocular Movements: Extraocular movements intact.     Pupils: Pupils are equal, round, and reactive to light.  Pulmonary:     Effort: No respiratory distress.  Musculoskeletal:     Comments: Cervical spine unremarkable. left shoulder good range of motion.  Minimally positive Hawkins.  Pain with cross body adduction.  Does have crepitus over the [facility] joint with this maneuver.  Tender over the [facility] joint.  Good cuff strength.  Negative drop arm test.  Neurovascular intact.  Neurological:     Mental Status: He is alert and oriented to person, place, and time.  Psychiatric:        Mood and Affect: Mood normal.        Behavior: Behavior normal.     Ortho Exam  Specialty Comments:  No specialty comments available.  Imaging: No results found.   PMFS History: Patient Active Problem List   Diagnosis Date Noted  . HTN (hypertension) 09/28/2010  . PAF (paroxysmal atrial fibrillation) (HCC) 09/28/2010   Past Medical History:  Diagnosis  Date  . Chronic back pain   . Diabetes mellitus   . Hyperlipidemia   . Hypertension   . PAF (paroxysmal atrial fibrillation) ([place])   . Palpitation   . Ulcerative colitis     Family History  Problem Relation Age of Onset  . Arrhythmia Father   . Cardiomyopathy Father   . Hypertension Father     Past Surgical History:  Procedure Laterality Date  . CARDIOVASCULAR STRESS TEST  12/24/2008   EF 66% with no ischemia  . TRANSTHORACIC ECHOCARDIOGRAM  05/30/2007    EF 55%   Social History   Occupational History  . Not on file  Tobacco Use  . Smoking status: Former Smoker    Quit date: 03/07/2006    Years since quitting: 12.5  . Smokeless tobacco: Never Used  Substance and Sexual Activity  .  Alcohol use: No  . Drug use: No  . Sexual activity: Yes

## 2018-10-08 NOTE — Progress Notes (Signed)
Subjective: He is here for diagnostic/therapeutic left SI joint injection.  Objective: Full range of motion of the shoulder with good rotator cuff strength.  Very tender at the [facility] joint with a positive AC crossover test.  Procedure: Left AC joint injection: After sterile prep with Betadine, injected 3 cc 1% lidocaine without epinephrine and 40 mg methylprednisolone using ultrasound to guide needle placement.  He had excellent pain relief during the immediate anesthetic phase.  He will follow-up as directed.

## 2019-03-27 ENCOUNTER — Ambulatory Visit: Payer: 59

## 2019-03-27 ENCOUNTER — Encounter: Payer: Self-pay | Admitting: Surgery

## 2019-03-27 ENCOUNTER — Other Ambulatory Visit: Payer: Self-pay

## 2019-03-27 ENCOUNTER — Ambulatory Visit: Payer: 59 | Admitting: Surgery

## 2019-03-27 VITALS — Ht 72.0 in | Wt 187.0 lb

## 2019-03-27 DIAGNOSIS — S83241D Other tear of medial meniscus, current injury, right knee, subsequent encounter: Secondary | ICD-10-CM | POA: Diagnosis not present

## 2019-03-27 DIAGNOSIS — M23203 Derangement of unspecified medial meniscus due to old tear or injury, right knee: Secondary | ICD-10-CM | POA: Diagnosis not present

## 2019-03-27 DIAGNOSIS — M7542 Impingement syndrome of left shoulder: Secondary | ICD-10-CM

## 2019-03-27 NOTE — Progress Notes (Signed)
Office Visit Note   Patient: [name]           Date of Birth: June 30, 1960           MRN: [id number] Visit Date: 03/27/2019              Requested by: Eartha Inch, MD [address] [place],  [place] [address] PCP: Eartha Inch, MD   Assessment & Plan: Visit Diagnoses:  1. Acute medial meniscus tear of right knee, subsequent encounter   2. Impingement syndrome of left shoulder   3. Old tear of medial meniscus of right knee, unspecified tear type     Plan: With patient's ongoing left shoulder pain that is failed conservative treatment with previous injection I recommend getting an MRI to rule out rotator cuff tear and other shoulder pathology.  For ongoing right knee pain and mechanical symptoms due to large meniscal tear that was seen on MRI 2019 only treatment option at this point there is outpatient arthroscopy.  When patient follows up with Dr. Ophelia Charter to review right shoulder MRI he can discuss with him scheduling surgery for the right knee versus left shoulder.  All questions answered.  Follow-Up Instructions: Return in about 3 weeks (around 04/17/2019) for Follow-up with Dr. Ophelia Charter to review left shoulder MRI and to discuss surgery for knee versus shoulder.   Orders:  Orders Placed This Encounter  Procedures  . XR KNEE 3 VIEW RIGHT  . MR Shoulder Left w/o contrast   No orders of the defined types were placed in this encounter.     Procedures: No procedures performed   Clinical Data: No additional findings.   Subjective: Chief Complaint  Patient presents with  . Left Shoulder - Follow-up  . Right Knee - Pain    HPI 59 year old white male returns for recheck of his chronic left shoulder pain and chronic right knee pain mechanical symptoms.  Patient has had ongoing right knee issues for least a couple years.  Previous MRI scan right knee December 18, 2017 showed large horizontal tear posterior horn and body of the medial meniscus with an associated  parameniscal cyst.  Patient did see Dr. Ophelia Charter after right knee MRI and was advised that he would need surgical invention with outpatient arthroscopy.  Patient did not have surgery due to his knee feeling somewhat better after intra-articular knee injection.  He continues have ongoing knee pain and mechanical symptoms.  Knee issues worse when he is squatting or pivoting.  Last seen by me August 2020 for left shoulder pain.  He did have AC joint injection with Dr. Prince Rome and this gave some improvement of his pain.  He continues have ongoing problems with overhead reaching and also going behind his back with his arm down at the side.  He states that the shoulder and knee problem is affecting his job.  He is wanting to get something done with both problems. Review of Systems No current cardiac pulmonary GI GU issues  Objective: Vital Signs: Ht 6' (1.829 m)   Wt 187 lb (84.8 kg)   BMI 25.36 kg/m   Physical Exam HENT:     Head: Normocephalic and atraumatic.  Pulmonary:     Effort: No respiratory distress.  Musculoskeletal:     Comments: Left shoulder good range of motion.  Positive impingement test.  He does have trace supraspinatus weakness with resistance.  Right knee good range of motion.  Mild swelling without significant effusion.  Medial joint line  tender.  Positive McMurray's test.  Calf nontender.  Neurovascularly intact.  Neurological:     General: No focal deficit present.     Mental Status: He is alert and oriented to person, place, and time.     Ortho Exam  Specialty Comments:  No specialty comments available.  Imaging: No results found.   PMFS History: Patient Active Problem List   Diagnosis Date Noted  . HTN (hypertension) 09/28/2010  . PAF (paroxysmal atrial fibrillation) ([facility]) 09/28/2010   Past Medical History:  Diagnosis Date  . Chronic back pain   . Diabetes mellitus   . Hyperlipidemia   . Hypertension   . PAF (paroxysmal atrial fibrillation) ([place])   .  Palpitation   . Ulcerative colitis     Family History  Problem Relation Age of Onset  . Arrhythmia Father   . Cardiomyopathy Father   . Hypertension Father     Past Surgical History:  Procedure Laterality Date  . CARDIOVASCULAR STRESS TEST  12/24/2008   EF 66% with no ischemia  . TRANSTHORACIC ECHOCARDIOGRAM  05/30/2007    EF 55%   Social History   Occupational History  . Not on file  Tobacco Use  . Smoking status: Former Smoker    Quit date: 03/07/2006    Years since quitting: 13.0  . Smokeless tobacco: Never Used  Substance and Sexual Activity  . Alcohol use: No  . Drug use: No  . Sexual activity: Yes

## 2019-04-09 ENCOUNTER — Other Ambulatory Visit: Payer: Self-pay

## 2019-04-09 ENCOUNTER — Ambulatory Visit
Admission: RE | Admit: 2019-04-09 | Discharge: 2019-04-09 | Disposition: A | Discharge status: Home / Self Care | Payer: 59 | Source: Ambulatory Visit | Attending: Surgery | Admitting: Surgery

## 2019-04-09 DIAGNOSIS — M7542 Impingement syndrome of left shoulder: Secondary | ICD-10-CM

## 2019-04-17 ENCOUNTER — Other Ambulatory Visit: Payer: Self-pay

## 2019-04-17 ENCOUNTER — Ambulatory Visit: Payer: 59 | Admitting: Orthopaedic Surgery

## 2019-04-17 ENCOUNTER — Encounter: Payer: Self-pay | Admitting: Orthopaedic Surgery

## 2019-04-17 DIAGNOSIS — S83241A Other tear of medial meniscus, current injury, right knee, initial encounter: Secondary | ICD-10-CM

## 2019-04-17 DIAGNOSIS — S83249S Other tear of medial meniscus, current injury, unspecified knee, sequela: Secondary | ICD-10-CM

## 2019-04-17 NOTE — Progress Notes (Signed)
Office Visit Note   Patient: [name]           Date of Birth: 04/04/1960           MRN: [id number] Visit Date: 04/17/2019              Requested by: Eartha Inch, MD [address] [place],  [place] [address] PCP: Eartha Inch, MD   Assessment & Plan: Visit Diagnoses:  1. Acute medial meniscal tear, sequela     Plan: Patient states he like to proceed with outpatient arthroscopy for the persistent problem with his right knee with torn meniscal tear.  He has put this off for over a year and states he like to get it done while the weather is cold and is less active running the golf course.  Patient procedure discussed.  Questions were elicited and answered.  Follow-Up Instructions: No follow-ups on file.   Orders:  No orders of the defined types were placed in this encounter.  No orders of the defined types were placed in this encounter.     Procedures: No procedures performed   Clinical Data: No additional findings.   Subjective: Chief Complaint  Patient presents with  . Left Shoulder - Pain, Follow-up    MRI Left Shoulder Review  . Right Knee - Pain, Follow-up    HPI 59 year old male returns with ongoing problems with his left shoulder and also right knee.  Right knee is continued to hurt with pain catching.  He is taken anti-inflammatories without relief had previous injection without relief.  Shoulder primarily bothers him with overhead activities and outstretched lifting.  MRI scan is available for review which shows some ADC joint arthropathy and small focal area of tendinopathy in the deep fibers of the distal subscapularis tendon otherwise normal.  Patient is doing activity modification to help with the shoulder symptoms.  He has pain when he reaches past the posterior axillary line.  Knee catches whenever he does pivoting or rotating.  He has had sharp catching but no true locking requiring manipulation of his knee.  His knee bothers him on a daily  basis.  MRI scan of his knee showed large horizontal tear posterior meniscus extending to the mid body of the medial meniscus with associated para meniscal cyst.  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  Review of Systems 14 point system update.  Positive for hypertension diabetes history of back pain.  Left shoulder pain and right knee posterior meniscal tear.   Objective: Vital Signs: BP 126/83   Pulse 77   Ht 6' (1.829 m)   Wt 175 lb (79.4 kg)   BMI 23.73 kg/m   Physical Exam Constitutional:      Appearance: He is well-developed.  HENT:     Head: Normocephalic and atraumatic.  Eyes:     Pupils: Pupils are equal, round, and reactive to light.  Neck:     Thyroid: No thyromegaly.     Trachea: No tracheal deviation.  Cardiovascular:     Rate and Rhythm: Normal rate.  Pulmonary:     Effort: Pulmonary effort is normal.     Breath sounds: No wheezing.  Abdominal:     General: Bowel sounds are normal.     Palpations: Abdomen is soft.  Skin:    General: Skin is warm  and dry.     Capillary Refill: Capillary refill takes less than 2 seconds.  Neurological:     Mental Status: He is alert and oriented to person, place, and time.  Psychiatric:        Behavior: Behavior normal.        Thought Content: Thought content normal.        Judgment: Judgment normal.     Ortho Exam patient has normal hip range of motion.  Some discomfort with internal rotation of left shoulder with Hande posterior axillary line.  He cannot reach further due to pain.  Mild tenderness over the bicipital groove.  He can reach his arm up over shoulder.  Left knee shows full range of motion no tenderness.  Right knee shows medial joint line tenderness worse in the posterior medial joint line.  Pain with hyperextension.  ACL PCL exam is normal.  Good flexion on 20 degrees.   He is amatory with trace limp right knee.  Specialty Comments:  No specialty comments available.  Imaging: CLINICAL DATA:  Chronic left shoulder pain with limited range of motion.  EXAM: MRI OF THE LEFT SHOULDER WITHOUT CONTRAST  TECHNIQUE: Multiplanar, multisequence MR imaging of the shoulder was performed. No intravenous contrast was administered.  COMPARISON:  Radiographs dated 10/08/2018  FINDINGS: Rotator cuff: There is a small focal area of tendinopathy of the deep fibers of the distal subscapularis tendon. Rotator cuff otherwise appears normal.  Muscles: No atrophy or abnormal signal of the muscles of the rotator cuff.  Biceps long head:  Properly located and intact.  Acromioclavicular Joint: Slight AC joint arthropathy with slight inflammation of the AC joint with a small joint effusion. Type 2 acromion. No bursitis  Glenohumeral Joint: No joint effusion. No chondral defect.  Labrum:  Intact.  Bones:  Normal.  Other: None  IMPRESSION: Slight AC joint arthropathy with inflammation at the [facility] joint.  Small focal area of tendinopathy of the deep fibers of the distal subscapularis tendon. Otherwise, normal exam.   Electronically Signed   By: Lorriane Shire M.D.   On: 04/09/2019 07:34   PMFS History: Patient Active Problem List   Diagnosis Date Noted  . Acute medial meniscal tear, sequela 04/19/2019  . HTN (hypertension) 09/28/2010  . PAF (paroxysmal atrial fibrillation) ([place]) 09/28/2010   Past Medical History:  Diagnosis Date  . Chronic back pain   . Diabetes mellitus   . Hyperlipidemia   . Hypertension   . PAF (paroxysmal atrial fibrillation) ([place])   . Palpitation   . Ulcerative colitis     Family History  Problem Relation Age of Onset  . Arrhythmia Father   . Cardiomyopathy Father   . Hypertension Father     Past Surgical History:  Procedure Laterality Date  . CARDIOVASCULAR STRESS TEST  12/24/2008   EF 66% with no  ischemia  . TRANSTHORACIC ECHOCARDIOGRAM  05/30/2007    EF 55%   Social History   Occupational History  . Not on file  Tobacco Use  . Smoking status: Former Smoker    Quit date: 03/07/2006    Years since quitting: 13.1  . Smokeless tobacco: Never Used  Substance and Sexual Activity  . Alcohol use: No  . Drug use: No  . Sexual activity: Yes

## 2019-04-19 DIAGNOSIS — S83249S Other tear of medial meniscus, current injury, unspecified knee, sequela: Secondary | ICD-10-CM | POA: Insufficient documentation

## 2019-04-22 ENCOUNTER — Telehealth: Payer: Self-pay | Admitting: Orthopaedic Surgery

## 2019-04-22 NOTE — Telephone Encounter (Signed)
FYI

## 2019-04-22 NOTE — Telephone Encounter (Signed)
Patient is scheduled for right knee arthroscopy Feb 22nd with Dr Ophelia Charter at [facility]. Patient was diagnosed with Paroxysmal atrial fibrillation in 2009.  Pre operative clearance signed by Dr. Lavenia Atlas with "Patient is low risk from a cardiac standpoint for the surgery proposed".     Clearance letter emailed to Carolanne Grumbling, RN at [facility] 04-22-19.

## 2019-04-29 ENCOUNTER — Other Ambulatory Visit: Payer: Self-pay | Admitting: Orthopaedic Surgery

## 2019-04-29 DIAGNOSIS — S83241S Other tear of medial meniscus, current injury, right knee, sequela: Secondary | ICD-10-CM

## 2019-04-29 MED ORDER — OXYCODONE-ACETAMINOPHEN 5-325 MG PO TABS: 1.0000 | ORAL_TABLET | Freq: Four times a day (QID) | ORAL | 0 refills | Status: DC | PRN

## 2019-04-29 NOTE — Progress Notes (Signed)
Post knee arthroscopy

## 2019-04-30 ENCOUNTER — Telehealth: Payer: Self-pay | Admitting: Radiology

## 2019-04-30 NOTE — Telephone Encounter (Signed)
Please advise on patient's dressing.

## 2019-04-30 NOTE — Telephone Encounter (Signed)
It is still there , he just did not know what he was looking at. He will shower and rewrap ace for swelling.

## 2019-04-30 NOTE — Telephone Encounter (Signed)
Patient called, states that he took his dressing off this morning and per the instructions that he was supposed to have a clear cover over the incision and he doesn't. He wants to know if this is ok or not. Please call him to advise.

## 2019-04-30 NOTE — Telephone Encounter (Signed)
I called discussed.  

## 2019-05-07 ENCOUNTER — Ambulatory Visit (INDEPENDENT_AMBULATORY_CARE_PROVIDER_SITE_OTHER): Payer: 59 | Admitting: Orthopaedic Surgery

## 2019-05-07 ENCOUNTER — Encounter: Payer: Self-pay | Admitting: Orthopaedic Surgery

## 2019-05-07 ENCOUNTER — Other Ambulatory Visit: Payer: Self-pay

## 2019-05-07 VITALS — BP 132/88 | HR 82 | Ht 72.0 in | Wt 175.0 lb

## 2019-05-07 DIAGNOSIS — S83249S Other tear of medial meniscus, current injury, unspecified knee, sequela: Secondary | ICD-10-CM

## 2019-05-07 NOTE — Progress Notes (Signed)
   Post-Op Visit Note   Patient: [name]           Date of Birth: [birth year]           MRN: [id number] Visit Date: 05/07/2019 PCP: Eartha Inch, MD   Assessment & Plan: Postop right knee arthroscopy partial medial meniscectomy.  He has less swelling than normal minimal pain medicine use he is using ice and intermittent ibuprofen as needed.  Walking better.  He still has some problems with his shoulder he has some deep subscap tendinopathy and also acromioclavicular degenerative changes.  On his return visit in 5 weeks we will consider lidocaine injection at his [facility] joint for diagnostic purposes.  He is happy with the results of knee arthroscopy.  Chief Complaint:  Chief Complaint  Patient presents with  . Right Knee - Routine Post Op    04/29/2019 Right knee arthroscopy, PMM   Visit Diagnoses:  1. Acute medial meniscal tear, sequela     Plan: Return 5 weeks.  Arthroscopic photos reviewed.  Follow-Up Instructions: Return in about 5 weeks (around 06/11/2019).   Orders:  No orders of the defined types were placed in this encounter.  No orders of the defined types were placed in this encounter.   Imaging: No results found.  PMFS History: Patient Active Problem List   Diagnosis Date Noted  . Acute medial meniscal tear, sequela 04/19/2019  . HTN (hypertension) 09/28/2010  . PAF (paroxysmal atrial fibrillation) ([facility]) 09/28/2010   Past Medical History:  Diagnosis Date  . Chronic back pain   . Diabetes mellitus   . Hyperlipidemia   . Hypertension   . PAF (paroxysmal atrial fibrillation) ([facility])   . Palpitation   . Ulcerative colitis     Family History  Problem Relation Age of Onset  . Arrhythmia Father   . Cardiomyopathy Father   . Hypertension Father     Past Surgical History:  Procedure Laterality Date  . CARDIOVASCULAR STRESS TEST  12/24/2008   EF 66% with no ischemia  . TRANSTHORACIC ECHOCARDIOGRAM  05/30/2007    EF 55%   Social History   Occupational  History  . Not on file  Tobacco Use  . Smoking status: Former Smoker    Quit date: 03/07/2006    Years since quitting: 13.1  . Smokeless tobacco: Never Used  Substance and Sexual Activity  . Alcohol use: No  . Drug use: No  . Sexual activity: Yes

## 2019-06-11 ENCOUNTER — Encounter: Payer: Self-pay | Admitting: Orthopaedic Surgery

## 2019-06-11 ENCOUNTER — Other Ambulatory Visit: Payer: Self-pay

## 2019-06-11 ENCOUNTER — Ambulatory Visit (INDEPENDENT_AMBULATORY_CARE_PROVIDER_SITE_OTHER): Payer: 59 | Admitting: Orthopaedic Surgery

## 2019-06-11 DIAGNOSIS — M19012 Primary osteoarthritis, left shoulder: Secondary | ICD-10-CM

## 2019-06-11 NOTE — Progress Notes (Signed)
Office Visit Note   Patient: [name]           Date of Birth: [birth year]           MRN: [id number] Visit Date: 06/11/2019              Requested by: Chesley Noon, MD [place],  [place] [address] PCP: Chesley Noon, MD   Assessment & Plan: Visit Diagnoses:  1. Arthritis of left acromioclavicular joint     Plan: We reviewed the MRI scan with patient he does have some mild acromioclavicular degenerative change with fluid but is minimally symptomatic currently.  We discussed activity modification he can return if he has increased symptoms.  He is happy with the results from his knee arthroscopy and partial meniscectomy with good relief of those symptoms.  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  We discussed distal clavicle excision of his symptoms got worse. Follow-Up Instructions: Return in about 1 month (around 07/11/2019).   Orders:  No orders of the defined types were placed in this encounter.  No orders of the defined types were placed in this encounter.     Procedures: No procedures performed   Clinical Data: No additional findings.   Subjective: Chief Complaint  Patient presents with  . Right Knee - Follow-up    04/29/2019 Right Knee Arthroscopy, PMM    HPI 59 year old male returns post knee arthroscopy states his knee is doing well after posterior medial meniscectomy.  He states he has had considerable problems with colitis and has been on steroids and is gradually on a taper and has had his occurred has had some increased problems and discomfort with his left shoulder.  He has had trouble reaching behind him past posterior axillary line with this hand.  He has trouble getting dressed since he tends to put his right arm on first.  Does better if he slips his left arm in the short first.  MRI scan has been performed is available for  review done on 04/09/2019.  Review of Systems updated unchanged since surgery.   Objective: Vital Signs: BP 129/86   Pulse 75   Ht 6' (1.829 m)   Wt 175 lb (79.4 kg)   BMI 23.73 kg/m   Physical Exam Constitutional:      Appearance: He is well-developed.  HENT:     Head: Normocephalic and atraumatic.  Eyes:     Pupils: Pupils are equal, round, and reactive to light.  Neck:     Thyroid: No thyromegaly.     Trachea: No tracheal deviation.  Cardiovascular:     Rate and Rhythm: Normal rate.  Pulmonary:     Effort: Pulmonary effort is normal.     Breath sounds: No wheezing.  Abdominal:     General: Bowel sounds are normal.     Palpations: Abdomen is soft.  Skin:    General: Skin is warm and dry.     Capillary Refill: Capillary refill takes less than 2 seconds.  Neurological:     Mental Status: He is alert and oriented to person, place, and time.  Psychiatric:        Behavior: Behavior normal.        Thought Content: Thought content normal.        Judgment: Judgment normal.     Ortho Exam patient has mild  tenderness of the acromioclavicular joint.  Minimal discomfort with crossarm test.  Negative drop arm test mild discomfort with Neer test.  Internal rotation hand limited to posterior axillary line.  Specialty Comments:  No specialty comments available.  Imaging: CLINICAL DATA:  Chronic left shoulder pain with limited range of motion.  EXAM: MRI OF THE LEFT SHOULDER WITHOUT CONTRAST  TECHNIQUE: Multiplanar, multisequence MR imaging of the shoulder was performed. No intravenous contrast was administered.  COMPARISON:  Radiographs dated 10/08/2018  FINDINGS: Rotator cuff: There is a small focal area of tendinopathy of the deep fibers of the distal subscapularis tendon. Rotator cuff otherwise appears normal.  Muscles: No atrophy or abnormal signal of the muscles of the rotator cuff.  Biceps long head:  Properly located and intact.  Acromioclavicular  Joint: Slight AC joint arthropathy with slight inflammation of the AC joint with a small joint effusion. Type 2 acromion. No bursitis  Glenohumeral Joint: No joint effusion. No chondral defect.  Labrum:  Intact.  Bones:  Normal.  Other: None  IMPRESSION: Slight AC joint arthropathy with inflammation at the [facility] joint.  Small focal area of tendinopathy of the deep fibers of the distal subscapularis tendon. Otherwise, normal exam.   Electronically Signed   By: Francene Boyers M.D.   On: 04/09/2019 07:34   PMFS History: Patient Active Problem List   Diagnosis Date Noted  . AC (acromioclavicular) joint arthritis 06/12/2019  . HTN (hypertension) 09/28/2010  . PAF (paroxysmal atrial fibrillation) ([facility]) 09/28/2010   Past Medical History:  Diagnosis Date  . Chronic back pain   . Diabetes mellitus   . Hyperlipidemia   . Hypertension   . PAF (paroxysmal atrial fibrillation) ([facility])   . Palpitation   . Ulcerative colitis     Family History  Problem Relation Age of Onset  . Arrhythmia Father   . Cardiomyopathy Father   . Hypertension Father     Past Surgical History:  Procedure Laterality Date  . CARDIOVASCULAR STRESS TEST  12/24/2008   EF 66% with no ischemia  . TRANSTHORACIC ECHOCARDIOGRAM  05/30/2007    EF 55%   Social History   Occupational History  . Not on file  Tobacco Use  . Smoking status: Former Smoker    Quit date: 03/07/2006    Years since quitting: 13.2  . Smokeless tobacco: Never Used  Substance and Sexual Activity  . Alcohol use: No  . Drug use: No  . Sexual activity: Yes

## 2019-06-12 DIAGNOSIS — M19019 Primary osteoarthritis, unspecified shoulder: Secondary | ICD-10-CM | POA: Insufficient documentation

## 2019-07-17 ENCOUNTER — Other Ambulatory Visit: Payer: Self-pay

## 2019-07-17 ENCOUNTER — Ambulatory Visit (INDEPENDENT_AMBULATORY_CARE_PROVIDER_SITE_OTHER): Payer: 59 | Admitting: Orthopaedic Surgery

## 2019-07-17 ENCOUNTER — Encounter: Payer: Self-pay | Admitting: Orthopaedic Surgery

## 2019-07-17 VITALS — BP 119/80 | HR 80 | Ht 72.0 in | Wt 175.0 lb

## 2019-07-17 DIAGNOSIS — M19012 Primary osteoarthritis, left shoulder: Secondary | ICD-10-CM

## 2019-07-17 NOTE — Progress Notes (Signed)
Office Visit Note   Patient: [name]           Date of Birth: March 26, 1960           MRN: [id number] Visit Date: 07/17/2019              Requested by: Eartha Inch, MD [address] [place],  [place] [address] PCP: Eartha Inch, MD   Assessment & Plan: Visit Diagnoses:  1. 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  He will call if he like to proceed with left acromioclavicular joint injection if he has increased symptoms.  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  He will call when he is ready to return.  He is continue to use ice topical rubs.  Follow-Up Instructions: No follow-ups on file.   Orders:  No orders of the defined types were placed in this encounter.  No orders of the defined types were placed in this encounter.     Procedures: No procedures performed   Clinical Data: No additional findings.   Subjective: Chief Complaint  Patient presents with  . Right Knee - Follow-up  . Left Shoulder - Follow-up    Left AC joint    HPI 59 year old male returns post knee arthroscopy  04/29/19 with no pain but considerable swelling at the end of the day.  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  He still has problems with his left acromioclavicular joint but at this point feels he can defer repeat injection which previously gave him good relief for several months before he had recurrence of symptoms.  He has diabetes A1c is less than 7.  He states he had some prednisone he had to take  orally due to colitis problems recently would like to avoid taking some cortisone for a while if possible.  Review of Systems is systems updated of note is history of PAF, hypertension, colitis previously treated with cortisone, right knee arthroscopy with posterior medial meniscal tear and left AC joint arthrosis with some rotator cuff tendinopathy otherwise all systems are negative.   Objective: Vital Signs: BP 119/80 (BP Location: Left Arm, Patient Position: Sitting, Cuff Size: Normal)   Pulse 80   Ht 6' (1.829 m)   Wt 175 lb (79.4 kg)   BMI 23.73 kg/m   Physical Exam Constitutional:      Appearance: He is well-developed.  HENT:     Head: Normocephalic and atraumatic.  Eyes:     Pupils: Pupils are equal, round, and reactive to light.  Neck:     Thyroid: No thyromegaly.     Trachea: No tracheal deviation.  Cardiovascular:     Rate and Rhythm: Normal rate.  Pulmonary:     Effort: Pulmonary effort is normal.     Breath sounds: No wheezing.  Abdominal:     General: Bowel sounds are normal.     Palpations: Abdomen is soft.  Skin:    General: Skin is warm and  dry.     Capillary Refill: Capillary refill takes less than 2 seconds.  Neurological:     Mental Status: He is alert and oriented to person, place, and time.  Psychiatric:        Behavior: Behavior normal.        Thought Content: Thought content normal.        Judgment: Judgment normal.     Ortho Exam patient has pain with crossarm test on the left negative on the right acromioclavicular joint is tender with palpation biceps tendon is normal negative drop arm test.  Arthroscopic portals right knee are well-healed he does not have a knee effusion noted today no pain with hip range of motion negative straight leg raising full extension good flexion of his knee and no medial joint line tenderness mild crepitus with knee extension and patellar loading.  Specialty Comments:  No specialty comments available.  Imaging: No  results found.   PMFS History: Patient Active Problem List   Diagnosis Date Noted  . AC (acromioclavicular) joint arthritis 06/12/2019  . HTN (hypertension) 09/28/2010  . PAF (paroxysmal atrial fibrillation) ([facility]) 09/28/2010   Past Medical History:  Diagnosis Date  . Chronic back pain   . Diabetes mellitus   . Hyperlipidemia   . Hypertension   . PAF (paroxysmal atrial fibrillation) ([facility])   . Palpitation   . Ulcerative colitis     Family History  Problem Relation Age of Onset  . Arrhythmia Father   . Cardiomyopathy Father   . Hypertension Father     Past Surgical History:  Procedure Laterality Date  . CARDIOVASCULAR STRESS TEST  12/24/2008   EF 66% with no ischemia  . TRANSTHORACIC ECHOCARDIOGRAM  05/30/2007    EF 55%   Social History   Occupational History  . Not on file  Tobacco Use  . Smoking status: Former Smoker    Quit date: 03/07/2006    Years since quitting: 13.3  . Smokeless tobacco: Never Used  Substance and Sexual Activity  . Alcohol use: No  . Drug use: No  . Sexual activity: Yes

## 2020-10-13 ENCOUNTER — Telehealth: Payer: Self-pay

## 2020-10-13 NOTE — Telephone Encounter (Signed)
Pt called and would like to set up an appt to see Dr. Alvester Morin again

## 2020-10-27 ENCOUNTER — Other Ambulatory Visit: Payer: Self-pay

## 2020-10-27 ENCOUNTER — Ambulatory Visit (INDEPENDENT_AMBULATORY_CARE_PROVIDER_SITE_OTHER): Payer: BC Managed Care – PPO | Admitting: Physical Medicine and Rehabilitation

## 2020-10-27 ENCOUNTER — Telehealth: Payer: Self-pay | Admitting: Physical Medicine and Rehabilitation

## 2020-10-27 ENCOUNTER — Encounter: Payer: Self-pay | Admitting: Physical Medicine and Rehabilitation

## 2020-10-27 ENCOUNTER — Ambulatory Visit (INDEPENDENT_AMBULATORY_CARE_PROVIDER_SITE_OTHER): Payer: Self-pay

## 2020-10-27 VITALS — BP 123/80 | HR 77

## 2020-10-27 DIAGNOSIS — M545 Low back pain, unspecified: Secondary | ICD-10-CM | POA: Diagnosis not present

## 2020-10-27 DIAGNOSIS — G8929 Other chronic pain: Secondary | ICD-10-CM | POA: Diagnosis not present

## 2020-10-27 DIAGNOSIS — M47816 Spondylosis without myelopathy or radiculopathy, lumbar region: Secondary | ICD-10-CM | POA: Diagnosis not present

## 2020-10-27 NOTE — Progress Notes (Signed)
Bar Bilateral low back pain. Sometimes pain radiates into buttock. Bilateral L5-S1 RFA 12/06/2019. Good relief until a few months ago. Numeric Pain Rating Scale and Functional Assessment Average Pain 3 Pain Right Now 3 My pain is constant, dull, and aching Pain is worse with: bending Pain improves with: rest and TENS   In the last MONTH (on 0-10 scale) has pain interfered with the following?  1. General activity like being  able to carry out your everyday physical activities such as walking, climbing stairs, carrying groceries, or moving a chair?  Rating(4)  2. Relation with others like being able to carry out your usual social activities and roles such as  activities at home, at work and in your community. Rating(4)  3. Enjoyment of life such that you have  been bothered by emotional problems such as feeling anxious, depressed or irritable?  Rating(2)

## 2020-10-27 NOTE — Progress Notes (Signed)
[name] - 60 y.o. male MRN [id number]  Date of birth: 1960-07-10  Office Visit Note: Visit Date: 10/27/2020 PCP: Eartha Inch, MD Referred by: Eartha Inch, MD  Subjective: Chief Complaint  Patient presents with   Lower Back - Pain   HPI: [name] is a 60 y.o. male who comes in today for evaluation chronic, worsening and severe bilateral lower back pain radiating to buttocks.  Patient reports chronic pain that worsened approximately 3 months ago after picking tomatoes in his garden.  Patient reports pain is exacerbated by bending over and activity.  Patient describes pain as a soreness and a tightness sensation to his bilateral lower back, rates pain as 3 out of 10 at present.  Patient states some relief of pain with stretching exercises at home, use of ice and Tylenol.  Patient's lumbar MRI from 2018 exhibits bilateral foraminal narrowing due to disc and facet degenerative changes, right broad based paracentral protrusion at L4-L5 and postoperative changes on the left at L5-S1. Patient was previously managed by Dr. Donette Larry at [facility] and had left L5-S1 discectomy performed in 2010. Prior to seeing Dr. Donette Larry he did attend physical therapy and was managing his pain with opioid medications. Patient had bilateral L5-S1 radiofrequency ablation performed in 2019 of which he reports good sustained pain relief until recent. Patient denies focal weakness, numbness and tingling. He denies recent trauma or fall. Denies bowel/bladder incontinence.    Review of Systems  Musculoskeletal:  Positive for back pain and joint pain.  Neurological:  Negative for tingling, sensory change and focal weakness.  All other systems reviewed and are negative. Otherwise per HPI.  Assessment & Plan: Visit Diagnoses:    ICD-10-CM   1. Chronic bilateral low back pain without sciatica  M54.50 XR Lumbar Spine 2-3 Views   G89.29     2. Spondylosis without myelopathy or  radiculopathy, lumbar region  M47.816     3. Facet arthropathy, lumbar  M47.816        Plan: Findings:  Chronic, worsening and severe bilateral lower back pain radiating to buttocks.  Patient's clinical presentation and imaging are consistent with facet mediated pain. No radicular pain noted at this time.  Patient continues to have severe pain with facet loading despite good conservative therapy such as stretching exercises use of ice and Tylenol.  We did complete a lumbar x-ray in the office today which exhibits facet arthropathy at L4-L5 and L5-S1. No listhesis noted.  Patient noted to have good and sustained pain relief with bilateral L5-S1 radiofrequency ablation in 2019.  We believe the neck step is to repeat bilateral L5-S1 radiofrequency ablation. If patient gets good and sustained pain relief with radiofrequency ablation we will continue to monitor, however if his pain returns shortly after procedure we will then consider obtaining a new lumbar MRI. No red flag symptoms noted upon exam today.    Meds & Orders: No orders of the defined types were placed in this encounter.   Orders Placed This Encounter  Procedures   XR Lumbar Spine 2-3 Views    Follow-up: Return in about 2 weeks (around 11/10/2020) for Bilateral L5-S1 radiofrequency ablation.   Procedures: No procedures performed      Clinical History: MRI LUMBAR SPINE WITHOUT AND WITH CONTRAST   TECHNIQUE: Multiplanar and multiecho pulse sequences of the lumbar spine were obtained without and with intravenous contrast.   CONTRAST:  16 ml MULTIHANCE GADOBENATE DIMEGLUMINE 529 MG/ML IV SOLN  COMPARISON:  MRI lumbar spine 05/15/2008. Two views lumbar spine performed at [facility] 08/23/2016.   FINDINGS: Segmentation:  Standard.   Alignment: Trace anterolisthesis L1 on L2 is noted. Otherwise maintained.   Vertebrae: No fracture or worrisome lesion. Degenerative endplate signal change is present at L4-5 and L5-S1.    Conus medullaris: Extends to the L1 level and appears normal.   Paraspinal and other soft tissues: A few tiny renal cysts are seen bilaterally. Otherwise negative.   Disc levels:   T11-12 is imaged in the sagittal plane only and negative.   T12-L1:  Negative.   L1-2: Shallow disc bulge without central canal or foraminal narrowing, unchanged.   L2-3:  Negative.   L3-4:  Negative.   L4-5: Shallow broad-based right paracentral protrusion causes mild narrowing in the subarticular recesses. Disc in conjunction with facet degenerative change results in severe right and moderate left foraminal narrowing.   L5-S1: There is postoperative change on the left where the patient is status post discectomy. Shallow disc bulge to the left is identified but the central canal is open. The foramina are also patent.   IMPRESSION: Severe right and moderate left foraminal narrowing at L4-5 due to disc and facet degenerative change. Shallow broad-based right paracentral protrusion at this level causes mild narrowing in the subarticular recesses without compression of the descending L5 roots identified.   Postoperative change of discectomy on the left at L5-S1. The central canal and foramina appear open at this level.     Electronically Signed   By: Drusilla Kanner M.D.   On: 09/09/2016 15:47   He reports that he quit smoking about 14 years ago. His smoking use included cigarettes. He has never used smokeless tobacco. No results for input(s): HGBA1C, LABURIC in the last 8760 hours.  Objective:  VS:  HT:    WT:   BMI:     BP:123/80  HR:77bpm  TEMP: ( )  RESP:  Physical Exam Constitutional:      Appearance: Normal appearance.  HENT:     Head: Normocephalic and atraumatic.     Right Ear: Tympanic membrane normal.     Left Ear: Tympanic membrane normal.     Nose: Nose normal.     Mouth/Throat:     Mouth: Mucous membranes are moist.  Eyes:     Pupils: Pupils are equal, round, and  reactive to light.  Cardiovascular:     Rate and Rhythm: Normal rate.     Pulses: Normal pulses.  Pulmonary:     Effort: Pulmonary effort is normal.  Abdominal:     General: Abdomen is flat. There is no distension.  Musculoskeletal:     Cervical back: Normal range of motion and neck supple.     Comments: Pt rises from seated position to standing without difficulty. Pain noted with facet loading. Strong distal strength without clonus, no pain upon palpation of greater trochanters. Sensation intact bilaterally. Walks independently, gait steady.     Skin:    General: Skin is warm and dry.     Capillary Refill: Capillary refill takes less than 2 seconds.  Neurological:     General: No focal deficit present.     Mental Status: He is alert.  Psychiatric:        Mood and Affect: Mood normal.    Ortho Exam  Imaging: No results found.  Past Medical/Family/Surgical/Social History: Medications & Allergies reviewed per EMR, new medications updated. Patient Active Problem List   Diagnosis Date Noted  AC (acromioclavicular) joint arthritis 06/12/2019   HTN (hypertension) 09/28/2010   PAF (paroxysmal atrial fibrillation) ([facility]) 09/28/2010   Past Medical History:  Diagnosis Date   Chronic back pain    Diabetes mellitus    Hyperlipidemia    Hypertension    PAF (paroxysmal atrial fibrillation) ([facility])    Palpitation    Ulcerative colitis    Family History  Problem Relation Age of Onset   Arrhythmia Father    Cardiomyopathy Father    Hypertension Father    Past Surgical History:  Procedure Laterality Date   CARDIOVASCULAR STRESS TEST  12/24/2008   EF 66% with no ischemia   TRANSTHORACIC ECHOCARDIOGRAM  05/30/2007    EF 55%   Social History   Occupational History   Not on file  Tobacco Use   Smoking status: Former    Types: Cigarettes    Quit date: 03/07/2006    Years since quitting: 14.6   Smokeless tobacco: Never  Substance and Sexual Activity   Alcohol use: No   Drug  use: No   Sexual activity: Yes

## 2020-10-27 NOTE — Telephone Encounter (Signed)
Needs authorization and scheduling for bilateral L5-S1 RFA. Patient has not been scheduled.

## 2020-11-17 ENCOUNTER — Encounter: Payer: Self-pay | Admitting: Physical Medicine and Rehabilitation

## 2020-11-17 ENCOUNTER — Ambulatory Visit (INDEPENDENT_AMBULATORY_CARE_PROVIDER_SITE_OTHER): Payer: BC Managed Care – PPO | Admitting: Physical Medicine and Rehabilitation

## 2020-11-17 ENCOUNTER — Other Ambulatory Visit: Payer: Self-pay

## 2020-11-17 ENCOUNTER — Ambulatory Visit: Payer: Self-pay

## 2020-11-17 VITALS — BP 130/90 | HR 77

## 2020-11-17 DIAGNOSIS — M47816 Spondylosis without myelopathy or radiculopathy, lumbar region: Secondary | ICD-10-CM

## 2020-11-17 MED ORDER — BETAMETHASONE SOD PHOS & ACET 6 (3-3) MG/ML IJ SUSP
12.0000 mg | Freq: Once | INTRAMUSCULAR | Status: AC
Start: 2020-11-17 — End: 2020-11-17
  Administered 2020-11-17: 12 mg

## 2020-11-17 NOTE — Procedures (Signed)
Lumbar Facet Joint Nerve Denervation  Patient: [name]      Date of Birth: 03/26/1960 MRN: [id number] PCP: Eartha Inch, MD      Visit Date: 11/17/2020   Universal Protocol:    Date/Time: 09/13/223:44 PM  Consent Given By: the patient  Position: PRONE  Additional Comments: Vital signs were monitored before and after the procedure. Patient was prepped and draped in the usual sterile fashion. The correct patient, procedure, and site was verified.   Injection Procedure Details:   Procedure diagnoses:  1. Spondylosis without myelopathy or radiculopathy, lumbar region      Meds Administered:  Meds ordered this encounter  Medications   betamethasone acetate-betamethasone sodium phosphate (CELESTONE) injection 12 mg     Laterality: Bilateral  Location/Site:  L5-S1, L4 medial branch and L5 dorsal ramus  Needle: 18 ga.,  12mm active tip RF Cannula  Needle Placement: Along juncture of superior articular process and transverse pocess  Findings:  -Comments:  Procedure Details: For each desired target nerve, the corresponding transverse process (sacral ala for the L5 dorsal rami) was identified and the fluoroscope was positioned to square off the endplates of the corresponding vertebral body to achieve a true AP midline view.  The beam was then obliqued 15 to 20 degrees and caudally tilted 15 to 20 degrees to line up a trajectory along the target nerves. The skin over the target of the junction of superior articulating process and transverse process (sacral ala for the L5 dorsal rami) was infiltrated with 49ml of 1% Lidocaine without Epinephrine.  The 18 gauge 64mm active tip outer cannula was advanced in trajectory view to the target.  This procedure was repeated for each target nerve.  Then, for all levels, the outer cannula placement was fine-tuned and the position was then confirmed with bi-planar imaging.    Test stimulation was done both at sensory and motor levels  to ensure there was no radicular stimulation. The target tissues were then infiltrated with 1 ml of 1% Lidocaine without Epinephrine. Subsequently, a percutaneous neurotomy was carried out for 90 seconds at 80 degrees Celsius.  After the completion of the lesion, 1 ml of injectate was delivered. It was then repeated for each facet joint nerve mentioned above. Appropriate radiographs were obtained to verify the probe placement during the neurotomy.   Additional Comments:  The patient tolerated the procedure well Dressing: 2 x 2 sterile gauze and Band-Aid    Post-procedure details: Patient was observed during the procedure. Post-procedure instructions were reviewed.  Patient left the clinic in stable condition.

## 2020-11-17 NOTE — Patient Instructions (Signed)

## 2020-11-17 NOTE — Progress Notes (Signed)
Pt state lower back pain that travels to his buttocks. Pt state any movement and working makes the pain worse. Pt state he takes over the counter pain meds and use ice to help ease his pain.  Numeric Pain Rating Scale and Functional Assessment Average Pain 2   In the last MONTH (on 0-10 scale) has pain interfered with the following?  1. General activity like being  able to carry out your everyday physical activities such as walking, climbing stairs, carrying groceries, or moving a chair?  Rating(7)   +Driver, -BT, -Dye Allergies.

## 2020-11-17 NOTE — Progress Notes (Signed)
[name] - 60 y.o. male MRN [id number]  Date of birth: 1960-08-16  Office Visit Note: Visit Date: 11/17/2020 PCP: Eartha Inch, MD Referred by: Eartha Inch, MD  Subjective: Chief Complaint  Patient presents with   Lower Back - Pain   HPI:  [name] is a 60 y.o. male who comes in today for planned repeat radiofrequency ablation of the Bilateral L5-S1  Lumbar facet joints. This would be ablation of the corresponding medial branches and/or dorsal rami.  Patient has had double diagnostic blocks with more than 70% relief.  Subsequent ablation gave them more than 6 months of over 60% relief.  They have had chronic back pain for quite some time, more than 3 months, which has been an ongoing situation with recalcitrant axial back pain.  They have no radicular pain.  Their axial pain is worse with standing and ambulating and on exam today with facet loading.  They have had physical therapy as well as home exercise program.  The imaging noted in the chart below indicated facet pathology. Accordingly they meet all the criteria and qualification for for radiofrequency ablation and we are going to complete this today hopefully for more longer term relief as part of comprehensive management program.   ROS Otherwise per HPI.  Assessment & Plan: Visit Diagnoses:    ICD-10-CM   1. Spondylosis without myelopathy or radiculopathy, lumbar region  M47.816 XR C-ARM NO REPORT    Radiofrequency,Lumbar    betamethasone acetate-betamethasone sodium phosphate (CELESTONE) injection 12 mg      Plan: No additional findings.   Meds & Orders:  Meds ordered this encounter  Medications   betamethasone acetate-betamethasone sodium phosphate (CELESTONE) injection 12 mg    Orders Placed This Encounter  Procedures   Radiofrequency,Lumbar   XR C-ARM NO REPORT    Follow-up: Return if symptoms worsen or fail to improve.   Procedures: No procedures performed  Lumbar Facet Joint Nerve  Denervation  Patient: [name]      Date of Birth: 22-Apr-1960 MRN: [id number] PCP: Eartha Inch, MD      Visit Date: 11/17/2020   Universal Protocol:    Date/Time: 09/13/223:44 PM  Consent Given By: the patient  Position: PRONE  Additional Comments: Vital signs were monitored before and after the procedure. Patient was prepped and draped in the usual sterile fashion. The correct patient, procedure, and site was verified.   Injection Procedure Details:   Procedure diagnoses:  1. Spondylosis without myelopathy or radiculopathy, lumbar region      Meds Administered:  Meds ordered this encounter  Medications   betamethasone acetate-betamethasone sodium phosphate (CELESTONE) injection 12 mg     Laterality: Bilateral  Location/Site:  L5-S1, L4 medial branch and L5 dorsal ramus  Needle: 18 ga.,  6mm active tip RF Cannula  Needle Placement: Along juncture of superior articular process and transverse pocess  Findings:  -Comments:  Procedure Details: For each desired target nerve, the corresponding transverse process (sacral ala for the L5 dorsal rami) was identified and the fluoroscope was positioned to square off the endplates of the corresponding vertebral body to achieve a true AP midline view.  The beam was then obliqued 15 to 20 degrees and caudally tilted 15 to 20 degrees to line up a trajectory along the target nerves. The skin over the target of the junction of superior articulating process and transverse process (sacral ala for the L5 dorsal rami) was infiltrated with 16ml of 1% Lidocaine without  Epinephrine.  The 18 gauge 43mm active tip outer cannula was advanced in trajectory view to the target.  This procedure was repeated for each target nerve.  Then, for all levels, the outer cannula placement was fine-tuned and the position was then confirmed with bi-planar imaging.    Test stimulation was done both at sensory and motor levels to ensure there was no  radicular stimulation. The target tissues were then infiltrated with 1 ml of 1% Lidocaine without Epinephrine. Subsequently, a percutaneous neurotomy was carried out for 90 seconds at 80 degrees Celsius.  After the completion of the lesion, 1 ml of injectate was delivered. It was then repeated for each facet joint nerve mentioned above. Appropriate radiographs were obtained to verify the probe placement during the neurotomy.   Additional Comments:  The patient tolerated the procedure well Dressing: 2 x 2 sterile gauze and Band-Aid    Post-procedure details: Patient was observed during the procedure. Post-procedure instructions were reviewed.  Patient left the clinic in stable condition.      Clinical History: MRI LUMBAR SPINE WITHOUT AND WITH CONTRAST   TECHNIQUE: Multiplanar and multiecho pulse sequences of the lumbar spine were obtained without and with intravenous contrast.   CONTRAST:  16 ml MULTIHANCE GADOBENATE DIMEGLUMINE 529 MG/ML IV SOLN   COMPARISON:  MRI lumbar spine 05/15/2008. Two views lumbar spine performed at [facility] 08/23/2016.   FINDINGS: Segmentation:  Standard.   Alignment: Trace anterolisthesis L1 on L2 is noted. Otherwise maintained.   Vertebrae: No fracture or worrisome lesion. Degenerative endplate signal change is present at L4-5 and L5-S1.   Conus medullaris: Extends to the L1 level and appears normal.   Paraspinal and other soft tissues: A few tiny renal cysts are seen bilaterally. Otherwise negative.   Disc levels:   T11-12 is imaged in the sagittal plane only and negative.   T12-L1:  Negative.   L1-2: Shallow disc bulge without central canal or foraminal narrowing, unchanged.   L2-3:  Negative.   L3-4:  Negative.   L4-5: Shallow broad-based right paracentral protrusion causes mild narrowing in the subarticular recesses. Disc in conjunction with facet degenerative change results in severe right and moderate  left foraminal narrowing.   L5-S1: There is postoperative change on the left where the patient is status post discectomy. Shallow disc bulge to the left is identified but the central canal is open. The foramina are also patent.   IMPRESSION: Severe right and moderate left foraminal narrowing at L4-5 due to disc and facet degenerative change. Shallow broad-based right paracentral protrusion at this level causes mild narrowing in the subarticular recesses without compression of the descending L5 roots identified.   Postoperative change of discectomy on the left at L5-S1. The central canal and foramina appear open at this level.     Electronically Signed   By: Drusilla Kanner M.D.   On: 09/09/2016 15:47     Objective:  VS:  HT:    WT:   BMI:     BP:130/90  HR:77bpm  TEMP: ( )  RESP:  Physical Exam Vitals and nursing note reviewed.  Constitutional:      General: He is not in acute distress.    Appearance: Normal appearance. He is not ill-appearing.  HENT:     Head: Normocephalic and atraumatic.     Right Ear: External ear normal.     Left Ear: External ear normal.     Nose: No congestion.  Eyes:     Extraocular Movements: Extraocular  movements intact.  Cardiovascular:     Rate and Rhythm: Normal rate.     Pulses: Normal pulses.  Pulmonary:     Effort: Pulmonary effort is normal. No respiratory distress.  Abdominal:     General: There is no distension.     Palpations: Abdomen is soft.  Musculoskeletal:        General: No tenderness or signs of injury.     Cervical back: Neck supple.     Right lower leg: No edema.     Left lower leg: No edema.     Comments: Patient has good distal strength without clonus. Patient somewhat slow to rise from a seated position to full extension.  There is concordant low back pain with facet loading and lumbar spine extension rotation.  There are no definitive trigger points but the patient is somewhat tender across the lower back and  PSIS.  There is no pain with hip rotation.   Skin:    Findings: No erythema or rash.  Neurological:     General: No focal deficit present.     Mental Status: He is alert and oriented to person, place, and time.     Sensory: No sensory deficit.     Motor: No weakness or abnormal muscle tone.     Coordination: Coordination normal.  Psychiatric:        Mood and Affect: Mood normal.        Behavior: Behavior normal.     Imaging: No results found.

## 2021-11-10 IMAGING — MR MR SHOULDER*L* W/O CM
4 of 5 series · 21 of 40 positions shown · non-contrast
Comparison: Radiographs dated 10/08/2018

CLINICAL DATA: Chronic left shoulder pain with limited range of
motion.

EXAM:
MRI OF THE LEFT SHOULDER WITHOUT CONTRAST
TECHNIQUE: Multiplanar, multisequence MR imaging of the shoulder was performed.
No intravenous contrast was administered.

[Series 6: PD fat-sat · axial · left · 4.0mm · 0.27mm/px · z∈[-61,+29]mm · 8 of 20 slices shown (1 of 2)]
[im 1/20]
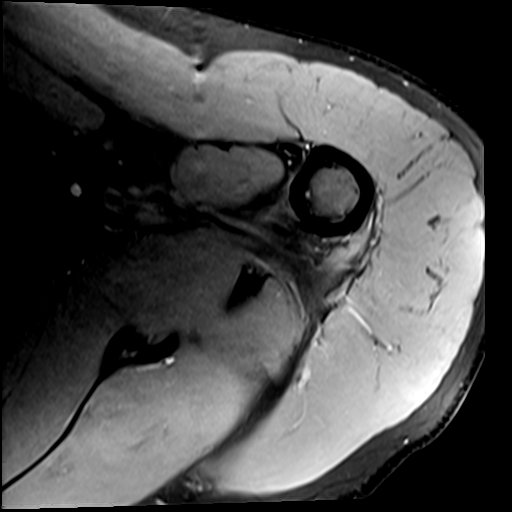
[im 3/20]
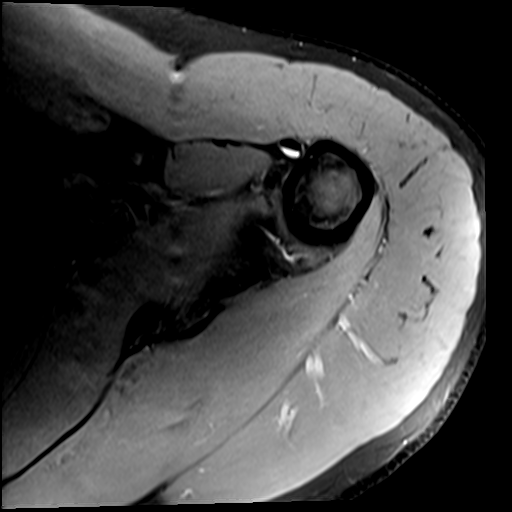
[im 6/20]
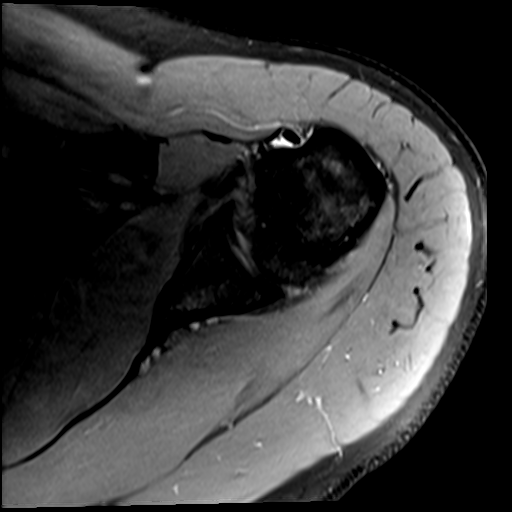
[im 9/20]
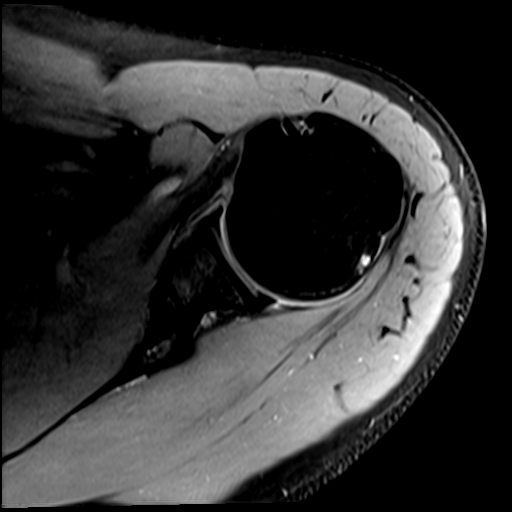
[im 11/20]
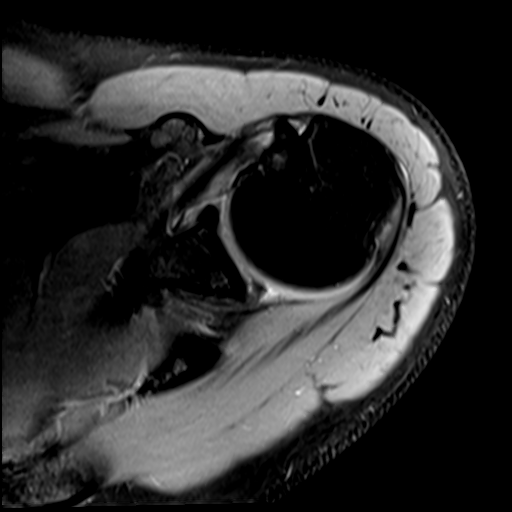
[im 14/20]
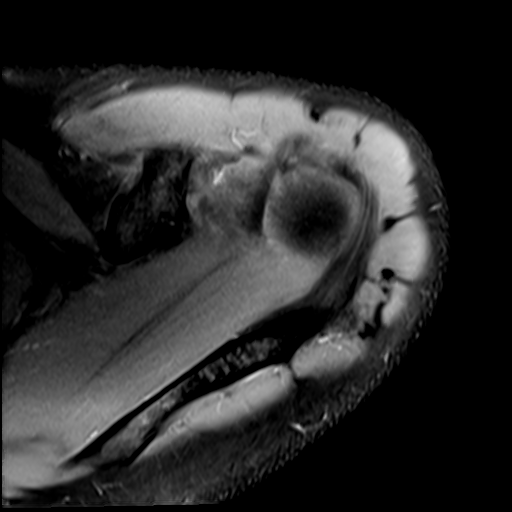
[im 17/20]
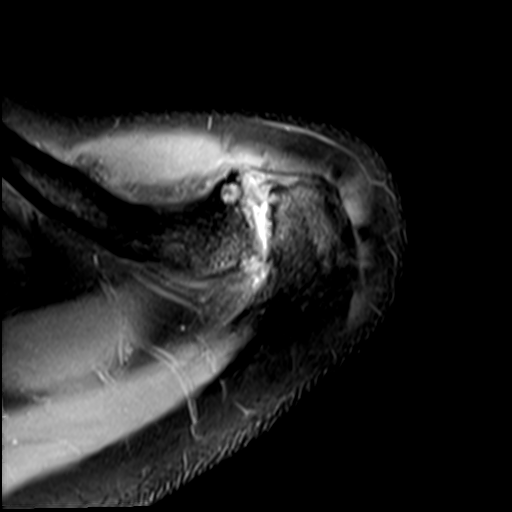
[im 20/20]
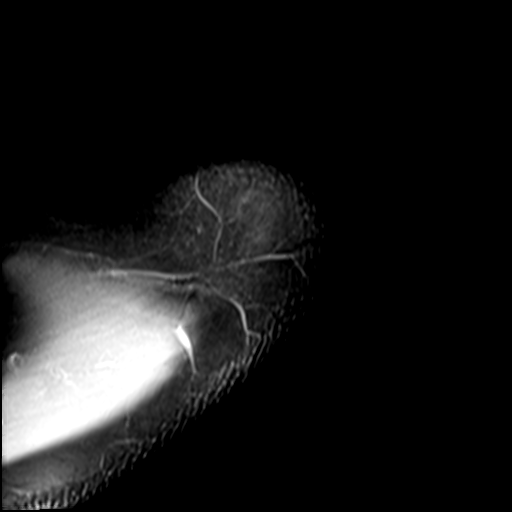

[Series 7: T2 fat-sat · oblique · left · 4.0mm · 0.27mm/px · 3 of 18 slices shown (1 of 2)]
[im 3/18]
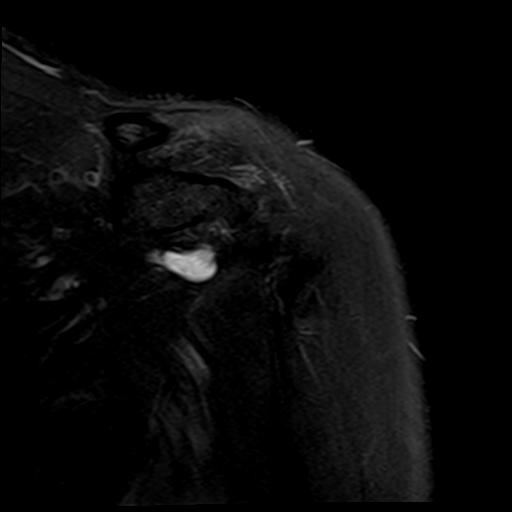
[im 9/18]
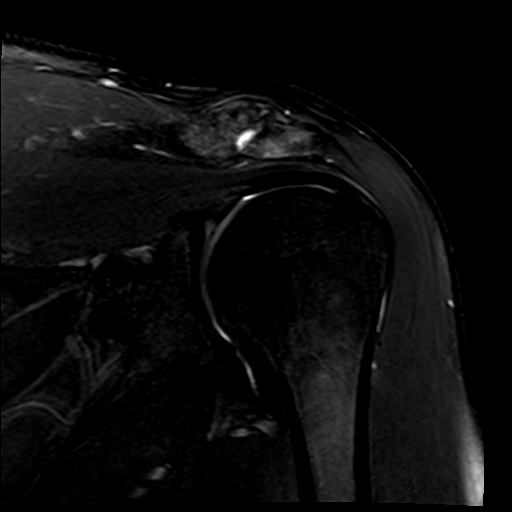
[im 15/18]
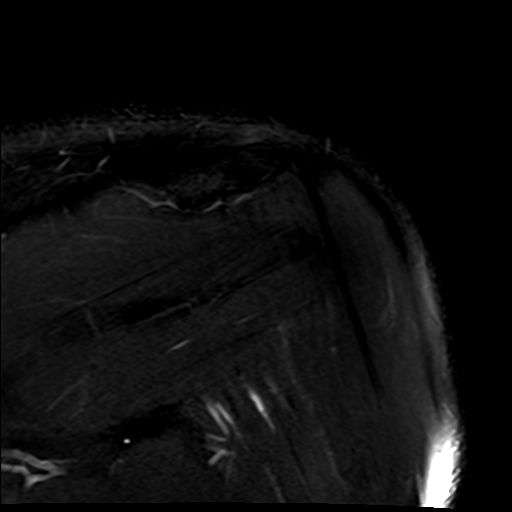

[Series 8: PD fat-sat · oblique · left · 4.0mm · 0.27mm/px · 7 of 18 slices shown (2 of 2)]
[im 1/18]
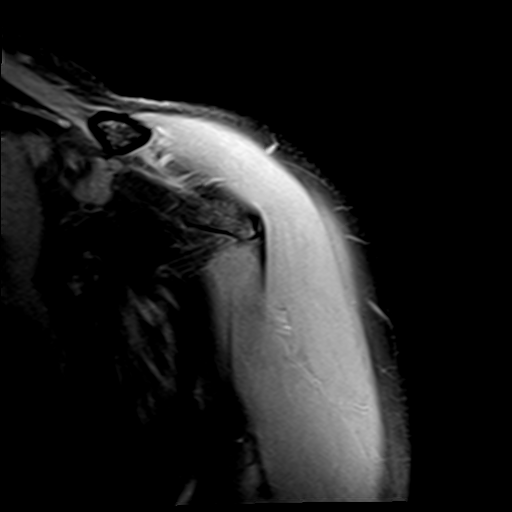
[im 3/18]
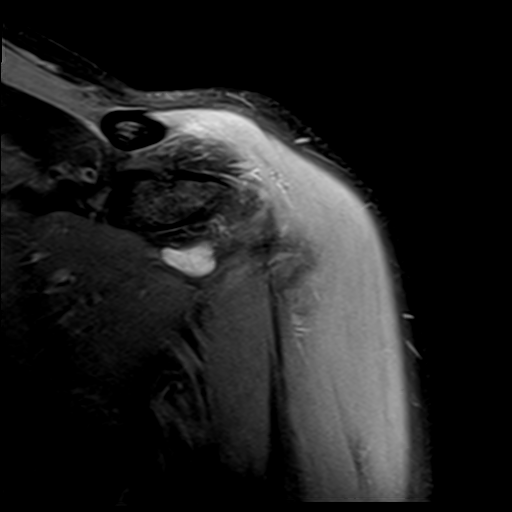
[im 6/18]
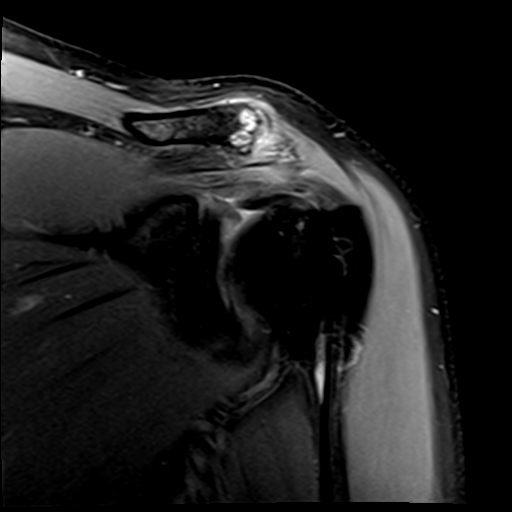
[im 9/18]
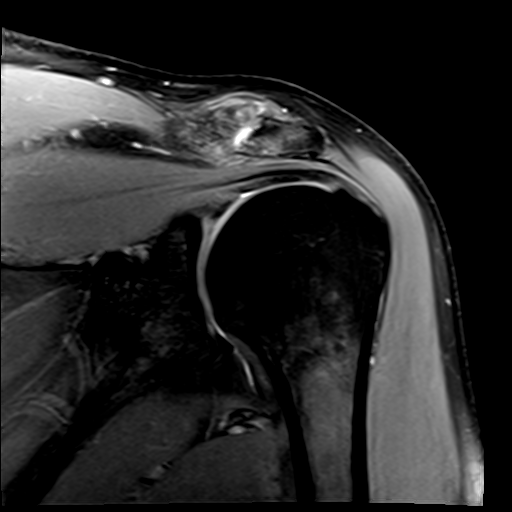
[im 12/18]
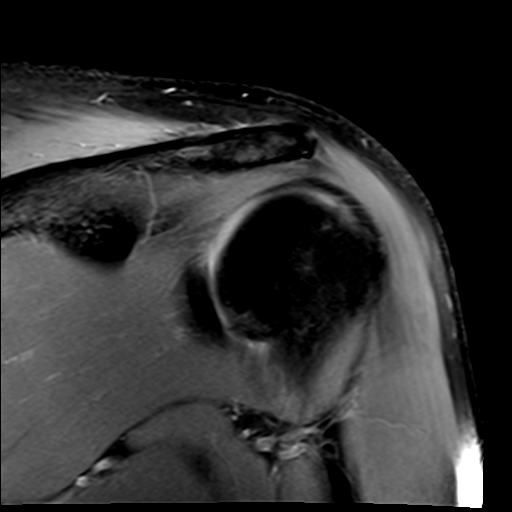
[im 15/18]
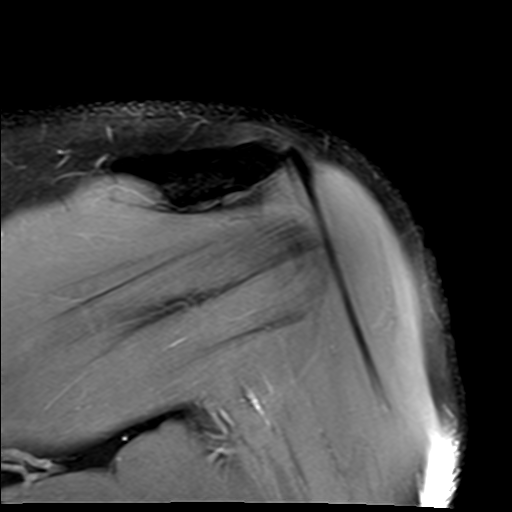
[im 18/18]
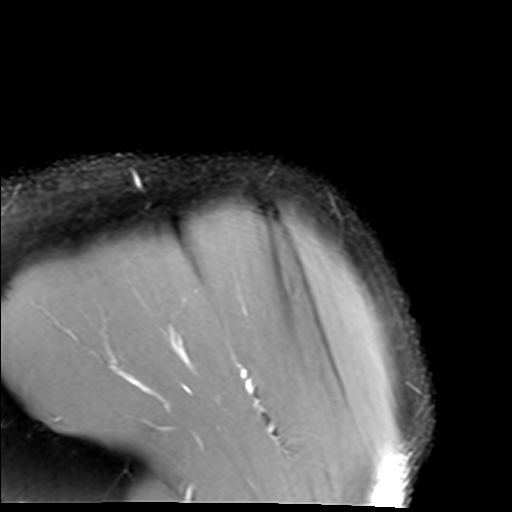

[Series 9: T2 fat-sat · sagittal · left · 4.0mm · 0.44mm/px · 3 of 23 slices shown (2 of 2)]
[im 3/23]
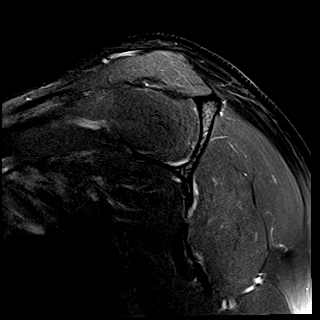
[im 12/23]
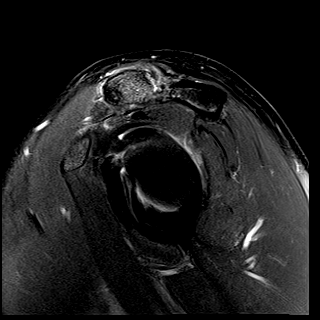
[im 20/23]
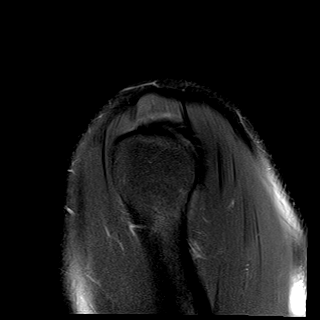

[21 of 40 positions shown; findings below may reference images not displayed]

FINDINGS: Rotator cuff: There is a small focal area of tendinopathy of the
deep fibers of the distal subscapularis tendon. Rotator cuff
otherwise appears normal.

Muscles: No atrophy or abnormal signal of the muscles of the rotator
cuff.

Biceps long head:  Properly located and intact.

Acromioclavicular Joint: Slight AC joint arthropathy with slight
inflammation of the AC joint with a small joint effusion. Type 2
acromion. No bursitis

Glenohumeral Joint: No joint effusion. No chondral defect.

Labrum:  Intact.

Bones:  Normal.

Other: None
IMPRESSION: Slight AC joint arthropathy with inflammation at the AC joint.

Small focal area of tendinopathy of the deep fibers of the distal
subscapularis tendon. Otherwise, normal exam.

## 2023-06-06 ENCOUNTER — Ambulatory Visit: Admitting: Sports Medicine

## 2023-06-06 ENCOUNTER — Other Ambulatory Visit: Payer: Self-pay

## 2023-06-06 ENCOUNTER — Other Ambulatory Visit (INDEPENDENT_AMBULATORY_CARE_PROVIDER_SITE_OTHER): Payer: Self-pay

## 2023-06-06 ENCOUNTER — Encounter: Payer: Self-pay | Admitting: Sports Medicine

## 2023-06-06 DIAGNOSIS — M542 Cervicalgia: Secondary | ICD-10-CM

## 2023-06-06 DIAGNOSIS — M19011 Primary osteoarthritis, right shoulder: Secondary | ICD-10-CM

## 2023-06-06 DIAGNOSIS — E119 Type 2 diabetes mellitus without complications: Secondary | ICD-10-CM

## 2023-06-06 DIAGNOSIS — M25511 Pain in right shoulder: Secondary | ICD-10-CM

## 2023-06-06 DIAGNOSIS — G8929 Other chronic pain: Secondary | ICD-10-CM | POA: Diagnosis not present

## 2023-06-06 MED ORDER — BUPIVACAINE HCL 0.25 % IJ SOLN
2.0000 mL | INTRAMUSCULAR | Status: AC | PRN
  Administered 2023-06-06: 2 mL via INTRA_ARTICULAR

## 2023-06-06 MED ORDER — METHYLPREDNISOLONE ACETATE 40 MG/ML IJ SUSP
40.0000 mg | INTRAMUSCULAR | Status: AC | PRN
  Administered 2023-06-06: 40 mg via INTRA_ARTICULAR

## 2023-06-06 MED ORDER — LIDOCAINE HCL 1 % IJ SOLN
2.0000 mL | INTRAMUSCULAR | Status: AC | PRN
  Administered 2023-06-06: 2 mL

## 2023-06-06 NOTE — Progress Notes (Signed)
 [name] - 63 y.o. male MRN [id number]  Date of birth: 1960/10/15  Office Visit Note: Visit Date: 06/06/2023 PCP: Eartha Inch, MD Referred by: Eartha Inch, MD  Subjective: Chief Complaint  Patient presents with   Right Shoulder - Pain   Neck - Pain   HPI: [name] is a pleasant 63 y.o. male who presents today for acute on chronic right shoulder pain, neck pain.  Right shoulder -he has had some pain in the right shoulder for a few years but very intermittent, over the last 6 months or so it has progressively worsened.  He does have pain now that wakes him at nighttime as he tries to find comfortable position.  He uses Advil occasionally.  Feels like the pain is deeper within the shoulder.  He does have some limitation with swinging his golf club.  Denies any weakness or numbness tingling.  He also has some right-sided posterior neck pain.  Has noticed some diminished range of motion with rotating specifically to the right.  No numbness tingling or weakness.  He is a type-II diabetic, well-controlled. Last A1c was 05/16/22 and was 7.0. He is managed on  Metformin 500mg  XR BID.  Pertinent ROS were reviewed with the patient and found to be negative unless otherwise specified above in HPI.   Assessment & Plan: Visit Diagnoses:  1. Chronic right shoulder pain   2. Primary osteoarthritis, right shoulder   3. Neck pain   4. Controlled type 2 diabetes mellitus without complication, without long-term current use of insulin (HCC)    Plan: Impression is chronic right shoulder pain with recent exacerbation over the last few months.  X-rays demonstrate only mild part arthritic change, he does have some pain with activation of the joint and to a lesser degree the infraspinatus tendon.  He has full strength so I am not concerned about any high-grade tearing.  Given this is interfering with his sleep, through shared decision making we did proceed with ultrasound-guided right  intra-articular shoulder injection, patient tolerated well.  He is a type II diabetic, but well-controlled, he does not need to check his sugars but aware of transient glucose rise from corticosteroid.  He will continue his metformin 500 mg XR twice daily.  Advised on modified rest/activity the next 48 hours and then into activity that restriction as his pain allows.  His neck pain is more so from the paraspinal musculature.  I did provide a handout for cervical isometrics which she will perform once daily.  Okay for over-the-counter ibuprofen, heat as well.  He will follow-up with me on an as-needed basis.  Follow-up: Return if symptoms worsen or fail to improve.   Meds & Orders: No orders of the defined types were placed in this encounter.   Orders Placed This Encounter  Procedures   Large Joint Inj   XR Shoulder Right   XR Cervical Spine 2 or 3 views   US Guided Needle Placement - No Linked Charges     Procedures: Large Joint Inj: R glenohumeral on 06/06/2023 3:19 PM Indications: pain Details: 22 G 3.5 in needle, ultrasound-guided posterior approach Medications: 2 mL lidocaine 1 %; 2 mL bupivacaine 0.25 %; 40 mg methylPREDNISolone acetate 40 MG/ML Outcome: tolerated well, no immediate complications  US-guided glenohumeral joint injection, right shoulder After discussion on risks/benefits/indications, informed verbal consent was obtained. A timeout was then performed. The patient was positioned lying lateral recumbent on examination table. The patient's shoulder was prepped with  betadine and multiple alcohol swabs and utilizing ultrasound guidance, the patient's glenohumeral joint was identified on ultrasound. Using ultrasound guidance a 22-gauge, 3.5 inch needle with a mixture of 2:2:1 cc's lidocaine:bupivicaine:depomedrol was directed from a lateral to medial direction via in-plane technique into the glenohumeral joint with visualization of appropriate spread of injectate into the joint.  Patient tolerated the procedure well without immediate complications.      Procedure, treatment alternatives, risks and benefits explained, specific risks discussed. Consent was given by the patient. Immediately prior to procedure a time out was called to verify the correct patient, procedure, equipment, support staff and site/side marked as required. Patient was prepped and draped in the usual sterile fashion.          Clinical History: No specialty comments available.  He reports that he quit smoking about 17 years ago. His smoking use included cigarettes. He has never used smokeless tobacco. No results for input(s): "HGBA1C", "LABURIC" in the last 8760 hours.  Objective:    Physical Exam  Gen: Well-appearing, in no acute distress; non-toxic CV: Well-perfused. Warm.  Resp: Breathing unlabored on room air; no wheezing. Psych: Fluid speech in conversation; appropriate affect; normal thought process  Ortho Exam - Right shoulder: There is no bony tenderness about the shoulder, no AC joint TTP.  There is full active and passive range of motion about the shoulder in all directions.  Negative drop arm, there is some mild discomfort with resisted internal rotation, although 5/5 strength of the rotator cuff tendon.  There is some very mild discomfort at the 90/90 position loading into the glenohumeral joint.  There is no redness swelling or effusion about the shoulder.  - Cervical spine: No midline spinous process TTP.  There is full range of motion with flexion and extension, there is some pain and mild restriction with right rotation. + Hypertonicity of the right paraspinal and SCM musculature.   Imaging: XR Shoulder Right Result Date: 06/06/2023 3 views of the right shoulder including AP, scapular Y, axial view were ordered and reviewed by myself today.  There is mild osteoarthritic change within the glenohumeral joint and the [facility] joint.  Very small spur off the inferior glenoid region.  No  acute fracture or otherwise acute bony abnormality noted.  XR Cervical Spine 2 or 3 views Result Date: 06/06/2023 2 views of the cervical spine including AP and lateral film were ordered and reviewed by myself.  There is mild DDD from the C5-C6 level.  Otherwise no acute bony abnormality noted.  *I did review MRI Left shoulder from 04/09/19: Narrative & Impression  CLINICAL DATA:  Chronic left shoulder pain with limited range of motion.   EXAM: MRI OF THE LEFT SHOULDER WITHOUT CONTRAST   TECHNIQUE: Multiplanar, multisequence MR imaging of the shoulder was performed. No intravenous contrast was administered.   COMPARISON:  Radiographs dated 10/08/2018   FINDINGS: Rotator cuff: There is a small focal area of tendinopathy of the deep fibers of the distal subscapularis tendon. Rotator cuff otherwise appears normal.   Muscles: No atrophy or abnormal signal of the muscles of the rotator cuff.   Biceps long head:  Properly located and intact.   Acromioclavicular Joint: Slight AC joint arthropathy with slight inflammation of the AC joint with a small joint effusion. Type 2 acromion. No bursitis   Glenohumeral Joint: No joint effusion. No chondral defect.   Labrum:  Intact.   Bones:  Normal.   Other: None   IMPRESSION: Slight AC joint arthropathy with  inflammation at the [facility] joint.   Small focal area of tendinopathy of the deep fibers of the distal subscapularis tendon. Otherwise, normal exam.     Electronically Signed   By: Francene Boyers M.D.   On: 04/09/2019 07:34    Past Medical/Family/Surgical/Social History: Medications & Allergies reviewed per EMR, new medications updated. Patient Active Problem List   Diagnosis Date Noted   AC (acromioclavicular) joint arthritis 06/12/2019   HTN (hypertension) 09/28/2010   PAF (paroxysmal atrial fibrillation) (HCC) 09/28/2010   Past Medical History:  Diagnosis Date   Chronic back pain    Diabetes mellitus    Hyperlipidemia     Hypertension    PAF (paroxysmal atrial fibrillation) (HCC)    Palpitation    Ulcerative colitis    Family History  Problem Relation Age of Onset   Arrhythmia Father    Cardiomyopathy Father    Hypertension Father    Past Surgical History:  Procedure Laterality Date   CARDIOVASCULAR STRESS TEST  12/24/2008   EF 66% with no ischemia   TRANSTHORACIC ECHOCARDIOGRAM  05/30/2007    EF 55%   Social History   Occupational History   Not on file  Tobacco Use   Smoking status: Former    Current packs/day: 0.00    Types: Cigarettes    Quit date: 03/07/2006    Years since quitting: 17.2   Smokeless tobacco: Never  Substance and Sexual Activity   Alcohol use: No   Drug use: No   Sexual activity: Yes

## 2023-06-06 NOTE — Progress Notes (Signed)
 Patient says that he has had right shoulder pain for a couple of years that has gotten worse over the last 6 months or so. He says it does not bother him day-to-day, but is keeping him from sleeping. He says that he also has pain when golfing, and if he has been golfing his pain does get worse at night. He is also having pain in the right side of his neck with ROM. Patient denies any numbness, tingling, or pain down the arm or to the hand. He did have an x-ray and MRI of the left shoulder in 2020/2021 and was told he had AC joint arthritis, but says that his left shoulder has not been bothering him. He does not take pain medication aside from occasional Ibuprofen at night for sleep.

## 2024-01-08 ENCOUNTER — Encounter: Payer: Self-pay | Admitting: Radiology

## 2024-04-11 ENCOUNTER — Ambulatory Visit: Admitting: Sports Medicine

## 2024-04-11 ENCOUNTER — Encounter: Payer: Self-pay | Admitting: Sports Medicine

## 2024-04-11 DIAGNOSIS — M12811 Other specific arthropathies, not elsewhere classified, right shoulder: Secondary | ICD-10-CM

## 2024-04-11 DIAGNOSIS — G8929 Other chronic pain: Secondary | ICD-10-CM

## 2024-04-11 DIAGNOSIS — M19011 Primary osteoarthritis, right shoulder: Secondary | ICD-10-CM

## 2024-04-11 MED ORDER — LIDOCAINE HCL 1 % IJ SOLN
2.0000 mL | INTRAMUSCULAR | Status: AC | PRN
  Administered 2024-04-11: 2 mL

## 2024-04-11 MED ORDER — BUPIVACAINE HCL 0.25 % IJ SOLN
2.0000 mL | INTRAMUSCULAR | Status: AC | PRN
  Administered 2024-04-11: 2 mL via INTRA_ARTICULAR

## 2024-04-11 MED ORDER — METHYLPREDNISOLONE ACETATE 40 MG/ML IJ SUSP
40.0000 mg | INTRAMUSCULAR | Status: AC | PRN
  Administered 2024-04-11: 40 mg via INTRA_ARTICULAR

## 2024-04-11 NOTE — Progress Notes (Signed)
 Patient says that he got relief from the injection in April for about 4-5 months, although his pain did not go away completely. He says that his pain has returned and gotten worse overtime; it does feel similar to the way it felt prior to the injection. He says that he does not have pain at rest, although does have pain when laying on his right side or reaching across his body with the right arm.

## 2024-04-11 NOTE — Progress Notes (Signed)
 "  [name] - 64 y.o. male MRN [id number]  Date of birth: Apr 08, 1960  Office Visit Note: Visit Date: 04/11/2024 PCP: Sophronia Ozell BROCKS, MD Referred by: Sophronia Ozell BROCKS, MD  Subjective: Chief Complaint  Patient presents with   Right Shoulder - Pain   HPI: [name] is a pleasant 64 y.o. male who presents today for f/u of chronic right shoulder pain.  Discussed the use of AI scribe software for clinical note transcription with the patient, who gave verbal consent to proceed.  History of Present Illness [name] is a 64 year old right-hand dominant male with right shoulder osteoarthritis and rotator cuff tendinopathy who presents for follow-up of persistent right shoulder pain.  Right shoulder pain began in April of last year. A shoulder injection (US -[facility]) at that time gave complete relief for 4 to 5 months, but the pain has gradually returned and has worsened over the past week, especially in the last few days.  Pain is intermittent, absent at rest and while sitting, and not constant. It is most pronounced at night when lying on the right side or with the arm elevated and often wakes him from sleep. Pain radiates down the lateral right arm but not past the elbow.  Abduction and certain activities cause sharp pain radiating down the arm that resolves within about 30 seconds. Pain is intermittent and unpredictable. He can raise the arm without pain but has discomfort with some resisted movements and when reaching behind his back. He notes stiffness with some motions, especially cross-body movement.  He is not using pain medications for the shoulder.   Pertinent ROS were reviewed with the patient and found to be negative unless otherwise specified above in HPI.   Assessment & Plan: Visit Diagnoses:  1. Right rotator cuff tear arthropathy   2. Primary osteoarthritis, right shoulder   3. Chronic right shoulder pain    Assessment & Plan Right shoulder osteoarthritis with  rotator cuff tendinopathy Chronic pain moreso due to rotator cuff tendinopathy, likely involving supraspinatus and possibly infraspinatus, with mild glenohumeral osteoarthritis. Symptoms are intermittent and activity-related, not functionally limiting. Strength preserved, no concern at this time for complete rotator cuff tear. No acute pathology requiring advanced imaging. Expected improvement with therapy and corticosteroid injection. - Referred for physical therapy focused on rotator cuff strengthening and activation. - Performed subacromial corticosteroid injection to reduce inflammation and pain, tolerated well. Advised on post-injection protocol. - Arranged follow-up in 6 weeks post-injection and therapy initiation to reassess symptoms and progress. - Deferred MRI; reconsider if symptoms persist or worsen despite conservative management. - Okay for Tylenol  or over-the-counter anti-inflammatories only as needed  *Additional considerations: MRI right shoulder  Follow-up: Return in about 6 weeks (around 05/23/2024) for R-shoulder f/u .   Meds & Orders: No orders of the defined types were placed in this encounter.   Orders Placed This Encounter  Procedures   Large Joint Inj     Procedures: Large Joint Inj: R subacromial bursa on 04/11/2024 5:17 PM Indications: pain Details: 22 G 1.5 in needle, posterior approach Medications: 2 mL lidocaine  1 %; 2 mL bupivacaine  0.25 %; 40 mg methylPREDNISolone  acetate 40 MG/ML Outcome: tolerated well, no immediate complications  Subacromial Joint Injection, Right Shoulder After discussion on risks/benefits/indications, informed verbal consent was obtained. A timeout was then performed. Patient was seated on table in exam room. The patient's shoulder was prepped with betadine and alcohol swabs and utilizing posterior approach a 22G, 1.5 needle was directed  anteriorly and laterally into the patient's subacromial space was injected with 2:2:1 mixture of  lidocaine :bupivicaine:depomedrol with appreciation of free-flowing of the injectate into the bursal space. Patient tolerated the procedure well without immediate complications.   Procedure, treatment alternatives, risks and benefits explained, specific risks discussed. Consent was given by the patient. Immediately prior to procedure a time out was called to verify the correct patient, procedure, equipment, support staff and site/side marked as required. Patient was prepped and draped in the usual sterile fashion.          Clinical History: No specialty comments available.  He reports that he quit smoking about 18 years ago. His smoking use included cigarettes. He has never used smokeless tobacco. No results for input(s): HGBA1C, LABURIC in the last 8760 hours.  Objective:    Physical Exam  Gen: Well-appearing, in no acute distress; non-toxic CV: Well-perfused. Warm.  Resp: Breathing unlabored on room air; no wheezing. Psych: Fluid speech in conversation; appropriate affect; normal thought process  *MSK/Ortho Exam: Physical Exam  *Right shoulder: No redness swelling or effusion.  There is pain around Codman's point.  There is full active and passive range of motion in all directions.  There is pain with mild weakness with resisted abduction as well as drop arm testing.  Internal and external range of motion relatively well-preserved.   MUSCULOSKELETAL: Pain on resisted abduction of the shoulder. Weakness on resisted abduction of the shoulder. Pain at 90 degrees during shoulder adduction. Pain on passive shoulder flexion. Stiffness on shoulder horizontal adduction. Mild pain on shoulder internal rotation.  Imaging: *Reviewed previous imaging as below during the visit today.  06/06/23: 3 views of the right shoulder including AP, scapular Y, axial view were ordered and reviewed by myself today. There is mild osteoarthritic change within the glenohumeral joint and the [facility] joint. Very small  spur off the inferior glenoid region. No acute fracture or otherwise acute bony abnormality noted.   Past Medical/Family/Surgical/Social History: Medications & Allergies reviewed per EMR, new medications updated. Patient Active Problem List   Diagnosis Date Noted   AC (acromioclavicular) joint arthritis 06/12/2019   HTN (hypertension) 09/28/2010   PAF (paroxysmal atrial fibrillation) (HCC) 09/28/2010   Past Medical History:  Diagnosis Date   Chronic back pain    Diabetes mellitus    Hyperlipidemia    Hypertension    PAF (paroxysmal atrial fibrillation) (HCC)    Palpitation    Ulcerative colitis    Family History  Problem Relation Age of Onset   Arrhythmia Father    Cardiomyopathy Father    Hypertension Father    Past Surgical History:  Procedure Laterality Date   CARDIOVASCULAR STRESS TEST  12/24/2008   EF 66% with no ischemia   TRANSTHORACIC ECHOCARDIOGRAM  05/30/2007    EF 55%   Social History   Occupational History   Not on file  Tobacco Use   Smoking status: Former    Current packs/day: 0.00    Types: Cigarettes    Quit date: 03/07/2006    Years since quitting: 18.1   Smokeless tobacco: Never  Substance and Sexual Activity   Alcohol use: No   Drug use: No   Sexual activity: Yes   "

## 2024-05-01 ENCOUNTER — Ambulatory Visit: Admitting: Rehabilitative and Restorative Service Providers"

## 2024-05-23 ENCOUNTER — Ambulatory Visit: Admitting: Sports Medicine
